# Patient Record
Sex: Female | Born: 1980 | Race: White | Hispanic: No | Marital: Single | Smoking: Never smoker
Health system: Southern US, Community
[De-identification: ages and names within clinical notes are randomized; demographics above are authoritative.]

## PROBLEM LIST (undated history)

## (undated) DIAGNOSIS — Z789 Other specified health status: Secondary | ICD-10-CM

## (undated) HISTORY — PX: NO PAST SURGERIES: SHX2092

## (undated) HISTORY — DX: Other specified health status: Z78.9

---

## 2010-05-20 ENCOUNTER — Inpatient Hospital Stay (HOSPITAL_COMMUNITY)
Admission: AD | Admit: 2010-05-20 | Discharge: 2010-05-23 | Payer: Self-pay | Source: Home / Self Care | Attending: Obstetrics & Gynecology | Admitting: Obstetrics & Gynecology

## 2010-05-22 LAB — RPR: RPR Ser Ql: NONREACTIVE

## 2010-05-22 LAB — CBC
MCV: 89.5 fL (ref 78.0–100.0)
Platelets: 250 10*3/uL (ref 150–400)
RBC: 4.37 MIL/uL (ref 3.87–5.11)
RDW: 13.4 % (ref 11.5–15.5)
WBC: 16.8 10*3/uL — ABNORMAL HIGH (ref 4.0–10.5)

## 2010-05-23 LAB — CBC
MCH: 29.6 pg (ref 26.0–34.0)
Platelets: 196 10*3/uL (ref 150–400)
RBC: 3.35 MIL/uL — ABNORMAL LOW (ref 3.87–5.11)

## 2010-05-23 NOTE — H&P (Addendum)
  Marilyn Frey, Marilyn Frey               ACCOUNT NO.:  [id number]  MEDICAL RECORD NO.:  [id number]          PATIENT TYPE:  INP  LOCATION:  9163                          FACILITY:  [facility]  PHYSICIAN:  Roseanna Rainbow, M.D.DATE OF BIRTH:  11-Dec-1980  DATE OF ADMISSION:  05/20/2010 DATE OF DISCHARGE:                             HISTORY & PHYSICAL   CHIEF COMPLAINTS:  The patient is a 30 year old, para 0 with an estimated date of confinement of May 26, 2010, complaining of contractions and bloody show.  HISTORY OF PRESENT ILLNESS:  Please see the above.  The patient denies rupture of membranes.  ALLERGIES:  CAINE ANESTHETICS.  MEDICATIONS:  Prenatal vitamins.  PRENATAL LABORATORY DATA:  Chlamydia probe negative.  Urine culture and sensitivity no growth.  GC probe negative.  GBS negative on April 25, 2010.  Hepatitis B surface antigen negative.  Hematocrit 35.5, hemoglobin 11.5.  HIV nonreactive.  Platelets 250,000.  Blood type is O+.  Antibody screen negative, RPR nonreactive, rubella immune. Varicella immune.  Two-hour GTT normal.  PAST GYNECOLOGICAL HISTORY:  Noncontributory.  PAST MEDICAL HISTORY:  No significant history of medical diseases.  PAST SURGICAL HISTORY:  No previous surgery.  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  She is single, living with the significant other, not using alcohol currently but formally a minimal user, has no significant smoking history.  She reports stress at work.  She denies illicit drug use.  FAMILY HISTORY:  Remarkable for diabetes or hypertension.  REVIEW OF SYSTEMS:  GU:  Please see the above.  PHYSICAL EXAMINATION:  VITAL SIGNS:  Stable, afebrile.  Fetal heart tracing baseline 140. Accelerations present.  No decelerations. Tocodynamometer coupling uterine contractions every 5 minutes. GENERAL:  Moderate distress. ABDOMEN:  Gravid. GU:  Sterile vaginal exam per the RN.  ASSESSMENT:   Nullipara at term, late latent versus early active labor. Category 1 fetal heart tracing.  PLAN:  Admission, expectant management, anticipate a spontaneous vaginal delivery.     Roseanna Rainbow, M.D.     Marilyn Frey  D:  05/21/2010  T:  05/21/2010  Job:  [id number]  Electronically Signed by Antionette Char M.D. on 05/23/2010 09:20:26 PM

## 2010-05-23 NOTE — Op Note (Addendum)
  Marilyn Frey, Marilyn Frey               ACCOUNT NO.:  [id number]  MEDICAL RECORD NO.:  [id number]          PATIENT TYPE:  INP  LOCATION:  9120                          [facility]ILITY:  [facility]  PHYSICIAN:  Roseanna Rainbow, M.D.DATE OF BIRTH:  [birth year]  DATE OF PROCEDURE:  05/21/2010 DATE OF DISCHARGE:                              OPERATIVE REPORT   DELIVERY NOTE:  The patient had been fully dilated with an active second stage 3 hours in duration.  There was maternal exhaustion.  Category 1 fetal heart tracing.  The position was felt to be OA and the station +3. Informed consent was obtained.  An in-and-out catheterization was performed.  The Kiwi vacuum was applied, however, it was difficult to maintain any suction.  The mushroom vacuum was then applied and after 3 efforts, there was crowning and a midline episiotomy was cut.  The infant's head was then delivered.  The cord was clamped and cut.  The infant was placed on the mother's abdomen.  The placenta was delivered with cord traction.  There were bilateral sulcal tears that were repaired with 2-0 Vicryl Rapide.  There was a left labial tear that was repaired with 3-0 Vicryl Rapide.  The remainder of the episiotomy without extension was repaired in the usual fashion with 2-0 Vicryl and 3-0 Vicryl Rapide.  The estimated blood loss was less than 500 mL.  The mother and infant were in stable condition.     Roseanna Rainbow, M.D.     [name]  D:  05/21/2010  T:  05/21/2010  Job:  [id number]  Electronically Signed by Antionette Char M.D. on 05/23/2010 09:21:36 PM

## 2011-04-30 NOTE — L&D Delivery Note (Signed)
Delivery Note At 10:36 PM a viable female was delivered via Vaginal, Spontaneous Delivery (Presentation: Left Occiput Posterior).  APGAR: 9, 9; weight: 3226 gms .   Placenta status: Intact, Spontaneous.  Cord: 3 vessels with the following complications: None.  Cord pH: none  Anesthesia: Epidural  Episiotomy: None Lacerations: None Suture Repair: None Est. Blood Loss (mL): 250  Mom to postpartum.  Baby to nursery-stable.  HARPER,CHARLES A 12/04/2011, 11:09 PM

## 2011-07-02 LAB — OB RESULTS CONSOLE ABO/RH

## 2011-07-02 LAB — OB RESULTS CONSOLE HEPATITIS B SURFACE ANTIGEN: Hepatitis B Surface Ag: NEGATIVE

## 2011-07-02 LAB — OB RESULTS CONSOLE RUBELLA ANTIBODY, IGM: Rubella: IMMUNE

## 2011-07-02 LAB — OB RESULTS CONSOLE ANTIBODY SCREEN: Antibody Screen: NEGATIVE

## 2011-07-02 LAB — OB RESULTS CONSOLE GC/CHLAMYDIA: Gonorrhea: NEGATIVE

## 2011-12-02 LAB — OB RESULTS CONSOLE GBS: GBS: NEGATIVE

## 2011-12-04 ENCOUNTER — Encounter (HOSPITAL_COMMUNITY): Payer: Self-pay | Admitting: Anesthesiology

## 2011-12-04 ENCOUNTER — Inpatient Hospital Stay (HOSPITAL_COMMUNITY): Payer: 59 | Admitting: Anesthesiology

## 2011-12-04 ENCOUNTER — Encounter (HOSPITAL_COMMUNITY): Payer: Self-pay | Admitting: *Deleted

## 2011-12-04 ENCOUNTER — Encounter (HOSPITAL_COMMUNITY): Payer: Self-pay | Admitting: Obstetrics

## 2011-12-04 ENCOUNTER — Inpatient Hospital Stay (HOSPITAL_COMMUNITY)
Admission: AD | Admit: 2011-12-04 | Discharge: 2011-12-06 | DRG: 775 | Disposition: A | Discharge status: Home / Self Care | Payer: 59 | Source: Ambulatory Visit | Attending: Obstetrics | Admitting: Obstetrics

## 2011-12-04 DIAGNOSIS — O4290 Premature rupture of membranes, unspecified as to length of time between rupture and onset of labor, unspecified weeks of gestation: Principal | ICD-10-CM | POA: Diagnosis present

## 2011-12-04 HISTORY — DX: Other specified health status: Z78.9

## 2011-12-04 LAB — CBC
HCT: 35 % — ABNORMAL LOW (ref 36.0–46.0)
Platelets: 223 10*3/uL (ref 150–400)
RBC: 3.83 MIL/uL — ABNORMAL LOW (ref 3.87–5.11)
RDW: 13.2 % (ref 11.5–15.5)
WBC: 10.6 10*3/uL — ABNORMAL HIGH (ref 4.0–10.5)

## 2011-12-04 MED ORDER — SODIUM CHLORIDE 0.9 % IV SOLN
3.0000 g | Freq: Four times a day (QID) | INTRAVENOUS | Status: DC
  Administered 2011-12-04: 3 g via INTRAVENOUS
  Filled 2011-12-04 (×5): qty 3

## 2011-12-04 MED ORDER — FENTANYL 2.5 MCG/ML BUPIVACAINE 1/10 % EPIDURAL INFUSION (WH - ANES)
14.0000 mL/h | INTRAMUSCULAR | Status: DC
  Administered 2011-12-04: 14 mL/h via EPIDURAL
  Filled 2011-12-04 (×2): qty 60

## 2011-12-04 MED ORDER — DIPHENHYDRAMINE HCL 50 MG/ML IJ SOLN: 12.5000 mg | INTRAMUSCULAR | Status: DC | PRN

## 2011-12-04 MED ORDER — LACTATED RINGERS IV SOLN
500.0000 mL | Freq: Once | INTRAVENOUS | Status: AC
  Administered 2011-12-04: 500 mL via INTRAVENOUS

## 2011-12-04 MED ORDER — NALBUPHINE HCL 10 MG/ML IJ SOLN
10.0000 mg | INTRAMUSCULAR | Status: DC | PRN
  Filled 2011-12-04: qty 1

## 2011-12-04 MED ORDER — CITRIC ACID-SODIUM CITRATE 334-500 MG/5ML PO SOLN: 30.0000 mL | ORAL | Status: DC | PRN

## 2011-12-04 MED ORDER — ONDANSETRON HCL 4 MG/2ML IJ SOLN: 4.0000 mg | Freq: Four times a day (QID) | INTRAMUSCULAR | Status: DC | PRN

## 2011-12-04 MED ORDER — IBUPROFEN 600 MG PO TABS: 600.0000 mg | ORAL_TABLET | Freq: Four times a day (QID) | ORAL | Status: DC | PRN

## 2011-12-04 MED ORDER — NALBUPHINE HCL 10 MG/ML IJ SOLN
10.0000 mg | Freq: Four times a day (QID) | INTRAMUSCULAR | Status: DC | PRN
  Filled 2011-12-04: qty 1

## 2011-12-04 MED ORDER — SODIUM BICARBONATE 8.4 % IV SOLN
INTRAVENOUS | Status: DC | PRN
  Administered 2011-12-04: 5 mL via EPIDURAL

## 2011-12-04 MED ORDER — FLEET ENEMA 7-19 GM/118ML RE ENEM: 1.0000 | ENEMA | RECTAL | Status: DC | PRN

## 2011-12-04 MED ORDER — OXYCODONE-ACETAMINOPHEN 5-325 MG PO TABS: 1.0000 | ORAL_TABLET | ORAL | Status: DC | PRN

## 2011-12-04 MED ORDER — OXYTOCIN 40 UNITS IN LACTATED RINGERS INFUSION - SIMPLE MED
1.0000 m[IU]/min | INTRAVENOUS | Status: DC
  Administered 2011-12-04: 2 m[IU]/min via INTRAVENOUS
  Filled 2011-12-04: qty 1000

## 2011-12-04 MED ORDER — FENTANYL 2.5 MCG/ML BUPIVACAINE 1/10 % EPIDURAL INFUSION (WH - ANES)
INTRAMUSCULAR | Status: DC | PRN
  Administered 2011-12-04: 14 mL/h via EPIDURAL

## 2011-12-04 MED ORDER — PHENYLEPHRINE 40 MCG/ML (10ML) SYRINGE FOR IV PUSH (FOR BLOOD PRESSURE SUPPORT): 80.0000 ug | PREFILLED_SYRINGE | INTRAVENOUS | Status: DC | PRN

## 2011-12-04 MED ORDER — OXYTOCIN 40 UNITS IN LACTATED RINGERS INFUSION - SIMPLE MED
62.5000 mL/h | Freq: Once | INTRAVENOUS | Status: AC
Start: 2011-12-04 — End: 2011-12-04
  Administered 2011-12-04: 62.5 mL/h via INTRAVENOUS

## 2011-12-04 MED ORDER — PHENYLEPHRINE 40 MCG/ML (10ML) SYRINGE FOR IV PUSH (FOR BLOOD PRESSURE SUPPORT)
80.0000 ug | PREFILLED_SYRINGE | INTRAVENOUS | Status: DC | PRN
  Administered 2011-12-04: 200 ug via INTRAVENOUS
  Filled 2011-12-04 (×2): qty 5

## 2011-12-04 MED ORDER — LACTATED RINGERS IV SOLN: 500.0000 mL | INTRAVENOUS | Status: DC | PRN

## 2011-12-04 MED ORDER — LIDOCAINE HCL (PF) 1 % IJ SOLN
30.0000 mL | INTRAMUSCULAR | Status: DC | PRN
  Filled 2011-12-04 (×2): qty 30

## 2011-12-04 MED ORDER — EPHEDRINE 5 MG/ML INJ
10.0000 mg | INTRAVENOUS | Status: DC | PRN
  Filled 2011-12-04 (×2): qty 4

## 2011-12-04 MED ORDER — TERBUTALINE SULFATE 1 MG/ML IJ SOLN: 0.2500 mg | Freq: Once | INTRAMUSCULAR | Status: AC | PRN

## 2011-12-04 MED ORDER — OXYTOCIN BOLUS FROM INFUSION
250.0000 mL | Freq: Once | INTRAVENOUS | Status: DC
  Filled 2011-12-04: qty 500

## 2011-12-04 MED ORDER — LACTATED RINGERS IV SOLN
INTRAVENOUS | Status: DC
  Administered 2011-12-04: 19:00:00 via INTRAVENOUS

## 2011-12-04 MED ORDER — EPHEDRINE 5 MG/ML INJ: 10.0000 mg | INTRAVENOUS | Status: DC | PRN

## 2011-12-04 MED ORDER — ACETAMINOPHEN 325 MG PO TABS: 650.0000 mg | ORAL_TABLET | ORAL | Status: DC | PRN

## 2011-12-04 MED ORDER — LIDOCAINE HCL (PF) 1 % IJ SOLN
INTRAMUSCULAR | Status: DC | PRN
  Administered 2011-12-04 (×2): 4 mL
  Administered 2011-12-04: 8 mL
  Administered 2011-12-04: 4 mL
  Administered 2011-12-04: 8 mL

## 2011-12-04 NOTE — Progress Notes (Signed)
Pt may eat a light snack prior to starting Pitocin per Dr. Clearance Coots

## 2011-12-04 NOTE — Progress Notes (Signed)
Marilyn Frey is a 31 y.o. G2P1001 at [redacted]w[redacted]d by LMP admitted for PROM  Subjective:   Objective: BP 120/74  Pulse 82  Temp 98.3 F (36.8 C) (Oral)  Resp 20  Ht 5\' 6"  (1.676 m)  Wt 84.369 kg (186 lb)  BMI 30.02 kg/m2  SpO2 100%      FHT:  FHR: 150 bpm, variability: moderate,  accelerations:  Present,  decelerations:  Absent UC:   Irregular, mild. SVE:   Dilation: 3.5 Effacement (%): 50 Station: -2 Exam by:: L. Paschal, RN  Labs: Lab Results  Component Value Date   WBC 10.6* 12/04/2011   HGB 11.5* 12/04/2011   HCT 35.0* 12/04/2011   MCV 91.4 12/04/2011   PLT 223 12/04/2011    Assessment / Plan: 36 weeks.  PPROM.  Start low dose pitocin.  Labor: Latent phase. Preeclampsia:  n/a Fetal Wellbeing:  Category I Pain Control:  Nubain I/D:  n/a Anticipated MOD:  NSVD  Jamyah Folk A 12/04/2011, 1:23 PM

## 2011-12-04 NOTE — Progress Notes (Signed)
Marilyn Frey is a 31 y.o. G2P1001 at [redacted]w[redacted]d by LMP admitted for PROM  Subjective:   Objective: BP 75/40  Pulse 100  Temp 98.5 F (36.9 C) (Oral)  Resp 20  Ht 5\' 6"  (1.676 m)  Wt 84.369 kg (186 lb)  BMI 30.02 kg/m2  SpO2 100%   Total I/O In: -  Out: 300 [Urine:300]  FHT:  FHR: 150 bpm, variability: moderate,  accelerations:  Present,  decelerations:  Absent UC:   regular, every 3 minutes SVE:   Dilation: 10 Effacement (%): 100 Station: +1;+2 Exam by:: [employer] RN  Labs: Lab Results  Component Value Date   WBC 10.6* 12/04/2011   HGB 11.5* 12/04/2011   HCT 35.0* 12/04/2011   MCV 91.4 12/04/2011   PLT 223 12/04/2011    Assessment / Plan: Induction of labor due to PROM,  progressing well on pitocin  Labor: Progressing normally Preeclampsia:  n/a Fetal Wellbeing:  Category I Pain Control:  Epidural I/D:  n/a Anticipated MOD:  NSVD  HARPER,CHARLES A 12/04/2011, 10:07 PM

## 2011-12-04 NOTE — Anesthesia Preprocedure Evaluation (Addendum)
Anesthesia Evaluation  Patient identified by MRN, date of birth, ID band Patient awake    Reviewed: Allergy & Precautions, H&P , NPO status , Patient's Chart, lab work & pertinent test results  Airway Mallampati: II TM Distance: >3 FB Neck ROM: full    Dental No notable dental hx.    Pulmonary neg pulmonary ROS,  breath sounds clear to auscultation  Pulmonary exam normal       Cardiovascular negative cardio ROS      Neuro/Psych negative neurological ROS  negative psych ROS   GI/Hepatic negative GI ROS, Neg liver ROS,   Endo/Other  negative endocrine ROS  Renal/GU negative Renal ROS  negative genitourinary   Musculoskeletal negative musculoskeletal ROS (+)   Abdominal Normal abdominal exam  (+)   Peds negative pediatric ROS (+)  Hematology negative hematology ROS (+)   Anesthesia Other Findings   Reproductive/Obstetrics (+) Pregnancy                           Anesthesia Physical Anesthesia Plan  ASA: II  Anesthesia Plan: Epidural   Post-op Pain Management:    Induction:   Airway Management Planned:   Additional Equipment:   Intra-op Plan:   Post-operative Plan:   Informed Consent: I have reviewed the patients History and Physical, chart, labs and discussed the procedure including the risks, benefits and alternatives for the proposed anesthesia with the patient or authorized representative who has indicated his/her understanding and acceptance.     Plan Discussed with:   Anesthesia Plan Comments:         Anesthesia Quick Evaluation  

## 2011-12-04 NOTE — MAU Note (Addendum)
Pt states her water broke about 0700 this am and keeps leaking clear fluid.  No vaginal bleeding.  Good fetal movement. GBS still pending at [facility] from 12-02-11.

## 2011-12-04 NOTE — Anesthesia Procedure Notes (Signed)
Epidural Patient location during procedure: OB Start time: 12/04/2011 7:38 PM End time: 12/04/2011 7:42 PM Reason for block: procedure for pain  Staffing Anesthesiologist: Sandrea Hughs Performed by: anesthesiologist   Preanesthetic Checklist Completed: patient identified, site marked, surgical consent, pre-op evaluation, timeout performed, IV checked, risks and benefits discussed and monitors and equipment checked  Epidural Patient position: sitting Prep: site prepped and draped and DuraPrep Patient monitoring: continuous pulse ox and blood pressure Approach: midline Injection technique: LOR air  Needle:  Needle type: Tuohy  Needle gauge: 17 G Needle length: 9 cm Needle insertion depth: 5 cm cm Catheter type: closed end flexible Catheter size: 19 Gauge Catheter at skin depth: 10 cm Test dose: negative and Other  Assessment Sensory level: T8 Events: blood not aspirated, injection not painful, no injection resistance, negative IV test and no paresthesia

## 2011-12-04 NOTE — H&P (Signed)
Marilyn Frey is a 31 y.o. female presenting for PPROM at 36 weeks.. Maternal Medical History:  Reason for admission: Reason for admission: rupture of membranes.  31 yo G2 P1.  EDC 12-29-11.  Presents with PROM.  GBS negative.  Fetal activity: Perceived fetal activity is normal.   Last perceived fetal movement was within the past hour.    Prenatal complications: no prenatal complications Prenatal Complications - Diabetes: none.    OB History    Grav Para Term Preterm Abortions TAB SAB Ect Mult Living   2 1 1       1      Past Medical History  Diagnosis Date  . No pertinent past medical history    Past Surgical History  Procedure Date  . No past surgeries    Family History: family history includes Diabetes in her father; Hyperlipidemia in her father and mother; and Hypertension in her father and mother. Social History:  reports that she has never smoked. She has never used smokeless tobacco. She reports that she does not drink alcohol or use illicit drugs.   Prenatal Transfer Tool  Maternal Diabetes: No Genetic Screening: Normal Maternal Ultrasounds/Referrals: Normal Fetal Ultrasounds or other Referrals:  None Maternal Substance Abuse:  No Significant Maternal Medications:  Meds include: Other: see prenatal record Significant Maternal Lab Results:  Lab values include: Group B Strep negative Other Comments:  None  Review of Systems  All other systems reviewed and are negative.    Dilation: 3.5 Effacement (%): 50 Station: -2 Exam by:: L. Paschal, RN Blood pressure 120/74, pulse 82, temperature 98.3 F (36.8 C), temperature source Oral, resp. rate 20, height 5\' 6"  (1.676 m), weight 84.369 kg (186 lb), SpO2 100.00%. Maternal Exam:  Uterine Assessment: Contraction strength is mild.  Contraction frequency is irregular.   Abdomen: Patient reports no abdominal tenderness. Fetal presentation: vertex  Introitus: Normal vulva. Normal vagina.    Physical Exam  Nursing  note and vitals reviewed. Constitutional: She is oriented to person, place, and time. She appears well-developed and well-nourished.  HENT:  Head: Normocephalic and atraumatic.  Eyes: Conjunctivae are normal. Pupils are equal, round, and reactive to light.  Neck: Normal range of motion. Neck supple.  Cardiovascular: Normal rate and regular rhythm.   Respiratory: Effort normal and breath sounds normal.  GI: Soft.  Genitourinary: Vagina normal and uterus normal.  Musculoskeletal: Normal range of motion.  Neurological: She is alert and oriented to person, place, and time.  Skin: Skin is warm and dry.  Psychiatric: She has a normal mood and affect. Her behavior is normal. Judgment and thought content normal.    Prenatal labs: ABO, Rh: O/Positive/-- (03/05 0000) Antibody: Negative (03/05 0000) Rubella: Immune (03/05 0000) RPR: Nonreactive (03/05 0000)  HBsAg: Negative (03/05 0000)  HIV: Non-reactive (03/05 0000)  GBS: Negative (08/05 0000)   Assessment/Plan: 36 weeks.  PPROM.  Admit.  Start low dose pitocin per protocol.   Ruey Storer A 12/04/2011, 1:17 PM

## 2011-12-05 LAB — CBC
MCV: 91.4 fL (ref 78.0–100.0)
Platelets: 195 10*3/uL (ref 150–400)
RBC: 3.39 MIL/uL — ABNORMAL LOW (ref 3.87–5.11)
RDW: 13.1 % (ref 11.5–15.5)
WBC: 16 10*3/uL — ABNORMAL HIGH (ref 4.0–10.5)

## 2011-12-05 MED ORDER — OXYTOCIN 40 UNITS IN LACTATED RINGERS INFUSION - SIMPLE MED: 62.5000 mL/h | INTRAVENOUS | Status: DC | PRN

## 2011-12-05 MED ORDER — DIBUCAINE 1 % RE OINT: 1.0000 "application " | TOPICAL_OINTMENT | RECTAL | Status: DC | PRN

## 2011-12-05 MED ORDER — SENNOSIDES-DOCUSATE SODIUM 8.6-50 MG PO TABS
2.0000 | ORAL_TABLET | Freq: Every day | ORAL | Status: DC
  Administered 2011-12-05: 2 via ORAL

## 2011-12-05 MED ORDER — TETANUS-DIPHTH-ACELL PERTUSSIS 5-2.5-18.5 LF-MCG/0.5 IM SUSP: 0.5000 mL | Freq: Once | INTRAMUSCULAR | Status: DC

## 2011-12-05 MED ORDER — ZOLPIDEM TARTRATE 5 MG PO TABS: 5.0000 mg | ORAL_TABLET | Freq: Every evening | ORAL | Status: DC | PRN

## 2011-12-05 MED ORDER — DIPHENHYDRAMINE HCL 25 MG PO CAPS: 25.0000 mg | ORAL_CAPSULE | Freq: Four times a day (QID) | ORAL | Status: DC | PRN

## 2011-12-05 MED ORDER — BENZOCAINE-MENTHOL 20-0.5 % EX AERO: 1.0000 "application " | INHALATION_SPRAY | CUTANEOUS | Status: DC | PRN

## 2011-12-05 MED ORDER — OXYCODONE-ACETAMINOPHEN 5-325 MG PO TABS: 1.0000 | ORAL_TABLET | ORAL | Status: DC | PRN

## 2011-12-05 MED ORDER — WITCH HAZEL-GLYCERIN EX PADS: 1.0000 "application " | MEDICATED_PAD | CUTANEOUS | Status: DC | PRN

## 2011-12-05 MED ORDER — LANOLIN HYDROUS EX OINT: TOPICAL_OINTMENT | CUTANEOUS | Status: DC | PRN

## 2011-12-05 MED ORDER — ONDANSETRON HCL 4 MG PO TABS: 4.0000 mg | ORAL_TABLET | ORAL | Status: DC | PRN

## 2011-12-05 MED ORDER — SIMETHICONE 80 MG PO CHEW: 80.0000 mg | CHEWABLE_TABLET | ORAL | Status: DC | PRN

## 2011-12-05 MED ORDER — MEDROXYPROGESTERONE ACETATE 150 MG/ML IM SUSP: 150.0000 mg | INTRAMUSCULAR | Status: DC | PRN

## 2011-12-05 MED ORDER — PRENATAL MULTIVITAMIN CH
1.0000 | ORAL_TABLET | Freq: Every day | ORAL | Status: DC
  Administered 2011-12-05 – 2011-12-06 (×2): 1 via ORAL
  Filled 2011-12-05: qty 1

## 2011-12-05 MED ORDER — ONDANSETRON HCL 4 MG/2ML IJ SOLN: 4.0000 mg | INTRAMUSCULAR | Status: DC | PRN

## 2011-12-05 MED ORDER — IBUPROFEN 600 MG PO TABS
600.0000 mg | ORAL_TABLET | Freq: Four times a day (QID) | ORAL | Status: DC
  Administered 2011-12-05 – 2011-12-06 (×5): 600 mg via ORAL
  Filled 2011-12-05 (×5): qty 1

## 2011-12-05 NOTE — Progress Notes (Signed)
UR chart review completed.  

## 2011-12-05 NOTE — Progress Notes (Signed)
Post Partum Day 1 Subjective: no complaints  Objective: Blood pressure 114/75, pulse 86, temperature 98.3 F (36.8 C), temperature source Oral, resp. rate 16, height 5\' 6"  (1.676 m), weight 84.369 kg (186 lb), SpO2 95.00%, unknown if currently breastfeeding.  Physical Exam:  General: alert and no distress Lochia: appropriate Uterine Fundus: firm Incision: none DVT Evaluation: No evidence of DVT seen on physical exam.   Basename 12/05/11 0520 12/04/11 1217  HGB 10.5* 11.5*  HCT 31.0* 35.0*    Assessment/Plan: Plan for discharge tomorrow   LOS: 1 day   Nicholis Stepanek A 12/05/2011, 1:26 PM

## 2011-12-05 NOTE — Anesthesia Postprocedure Evaluation (Signed)
  Anesthesia Post Note  Patient: Marilyn Frey  Procedure(s) Performed: * No procedures listed *  Anesthesia type: Epidural  Patient location: Mother/Baby  Post pain: Pain level controlled  Post assessment: Post-op Vital signs reviewed  Last Vitals:  Filed Vitals:   12/05/11 0530  BP: 114/75  Pulse: 86  Temp: 36.8 C  Resp: 16    Post vital signs: Reviewed  Level of consciousness:alert  Complications: No apparent anesthesia complications

## 2011-12-06 MED ORDER — IBUPROFEN 600 MG PO TABS: 600.0000 mg | ORAL_TABLET | Freq: Four times a day (QID) | ORAL | Status: DC

## 2011-12-06 MED ORDER — OXYCODONE-ACETAMINOPHEN 5-325 MG PO TABS: 1.0000 | ORAL_TABLET | ORAL | Status: AC | PRN

## 2011-12-06 NOTE — Progress Notes (Signed)
Post Partum Day 2 Subjective: no complaints  Objective: Blood pressure 116/77, pulse 73, temperature 97.9 F (36.6 C), temperature source Oral, resp. rate 18, height 5\' 6"  (1.676 m), weight 84.369 kg (186 lb), SpO2 95.00%, unknown if currently breastfeeding.  Physical Exam:  General: alert and no distress Lochia: appropriate Uterine Fundus: firm Incision: healing well DVT Evaluation: No evidence of DVT seen on physical exam.   Basename 12/05/11 0520 12/04/11 1217  HGB 10.5* 11.5*  HCT 31.0* 35.0*    Assessment/Plan: Discharge home   LOS: 2 days   HARPER,CHARLES A 12/06/2011, 9:29 AM

## 2011-12-06 NOTE — Discharge Summary (Signed)
Obstetric Discharge Summary Reason for Admission: onset of labor Prenatal Procedures: ultrasound Intrapartum Procedures: spontaneous vaginal delivery Postpartum Procedures: None Complications-Operative and Postpartum: none Hemoglobin  Date Value Range Status  12/05/2011 10.5* 12.0 - 15.0 g/dL Final     HCT  Date Value Range Status  12/05/2011 31.0* 36.0 - 46.0 % Final    Physical Exam:  General: alert and no distress Lochia: appropriate Uterine Fundus: firm Incision: healing well DVT Evaluation: No evidence of DVT seen on physical exam.  Discharge Diagnoses: Term Pregnancy-delivered  Discharge Information: Date: 12/06/2011 Activity: pelvic rest Diet: routine Medications: PNV, Ibuprofen, Colace and Percocet Condition: stable Instructions: refer to practice specific booklet Discharge to: home Follow-up Information    Follow up with Leani Myron A, MD. Schedule an appointment as soon as possible for a visit in 6 weeks.   Contact information:   [address] [place] [address] [phone]          Newborn Data: Live born female  Birth Weight: 7 lb 1.8 oz (3226 g) APGAR: 9, 9  Home with mother.  Myla Mauriello A 12/06/2011, 9:37 AM

## 2014-02-28 ENCOUNTER — Encounter (HOSPITAL_COMMUNITY): Payer: Self-pay | Admitting: Obstetrics

## 2019-04-30 NOTE — L&D Delivery Note (Signed)
Delivery Note Pt progressed w/minimal bleeding to C/C/+1./  After a 10 minute 2nd stage, at 10:41 PM a viable female was delivered via Vaginal, Spontaneous (Presentation: Left Occiput Anterior, loose nuchal/body cord, delivered through).  APGAR: 7, 9; weight 8 lb 9.2 oz (3890 g).  After 1 minute, the cord was clamped and cut. 40 units of pitocin diluted in 1000cc LR was infused rapidly IV.  The placenta separated spontaneously and delivered via CCT and maternal pushing effort.  It was inspected and appears to be intact with a 3 VC.  Anesthesia: Epidural Episiotomy: None Lacerations: 1st degree;Vaginal Suture Repair: 2.0 chromic Est. Blood Loss (mL): 386  Mom to postpartum.  Baby to Couplet care / Skin to Skin.  Scarlette Calico Cresenzo-Dishmon 12/31/2019, 12:49 AM

## 2019-06-25 ENCOUNTER — Ambulatory Visit (INDEPENDENT_AMBULATORY_CARE_PROVIDER_SITE_OTHER): Payer: 59 | Admitting: *Deleted

## 2019-06-25 ENCOUNTER — Other Ambulatory Visit: Payer: Self-pay

## 2019-06-25 DIAGNOSIS — Z3201 Encounter for pregnancy test, result positive: Secondary | ICD-10-CM

## 2019-06-25 DIAGNOSIS — Z3200 Encounter for pregnancy test, result unknown: Secondary | ICD-10-CM

## 2019-06-25 LAB — POCT PREGNANCY, URINE: Preg Test, Ur: POSITIVE — AB

## 2019-06-25 NOTE — Progress Notes (Signed)
Pt here for pregnancy test. She was informed of positive result. She reports approximate LMP 03/25/19 which would yield EDD 12/30/19 - now 13w 2d. Pt denies having problems currently. She is taking PNV.  Pt has had prenatal care @ [facility] location in the past and reports no problems. She desires prenatal care in this office for current pregnancy. Appts will be scheduled.

## 2019-06-25 NOTE — Progress Notes (Signed)
Patient ID: Marilyn Frey, female   DOB: 11/15/1980, 39 y.o.   MRN: [id number] Patient seen and assessed by nursing staff during this encounter. I have reviewed the chart and agree with the documentation and plan.  Scheryl Darter, MD 06/25/2019 10:48 AM

## 2019-07-22 ENCOUNTER — Ambulatory Visit (INDEPENDENT_AMBULATORY_CARE_PROVIDER_SITE_OTHER): Payer: Medicaid Other | Admitting: *Deleted

## 2019-07-22 ENCOUNTER — Other Ambulatory Visit: Payer: Self-pay

## 2019-07-22 DIAGNOSIS — O0990 Supervision of high risk pregnancy, unspecified, unspecified trimester: Secondary | ICD-10-CM

## 2019-07-22 DIAGNOSIS — O09529 Supervision of elderly multigravida, unspecified trimester: Secondary | ICD-10-CM

## 2019-07-22 MED ORDER — BLOOD PRESSURE KIT DEVI: 1.0000 | 0 refills | Status: AC | PRN

## 2019-07-22 NOTE — Progress Notes (Addendum)
I connected with  Marilyn Frey on 07/22/19 at  3:30 PM EDT by telephone and verified that I am speaking with the correct person using two identifiers.   I discussed the limitations, risks, security and privacy concerns of performing an evaluation and management service by telephone and the availability of in person appointments. I also discussed with the patient that there may be a patient responsible charge related to this service. The patient expressed understanding and agreed to proceed.  I explained I am completing her New OB Intake today. We discussed Her EDD and that it is based on  sure LMP . I reviewed her allergies, meds, OB History, Medical /Surgical history, and appropriate screenings. I informed her of [facility] services.  I explained I will send her the Babyscripts app and app was sent to her while on phone.  I explained we will send a blood pressure cuff to [facility] that will fill that prescription and we called [facility] to verify they received her prescription and confirmed she will pick up the cuff.  I asked her to bring the blood pressure cuff with her to her first ob appointment so we can show her how to use it. Explained  then we will have her take her blood pressure weekly and enter into the app. I explained she will have some visits in office and some virtually. She already has MyChart and I assisted her with downloading the  app. I reviewed her new ob  appointment date/ time with her , our location and to wear mask, no visitors.  I explained she will have a pelvic exam, ob bloodwork, hemoglobin a1C, cbg ,pap, and  genetic testing if desired,- she does want a panorama.She also has not had a flu shot and would like to get that .  I scheduled an [place] as close to 19 weeks as possible and at a time Jazalyn could attend. I  gave her the appointment. She voices understanding.  Ilona Colley,RN 07/22/2019  3:29 PM    Chart reviewed for nurse visit. Agree with plan of care.   Currie Paris, NP 07/22/2019 7:04 PM

## 2019-07-22 NOTE — Patient Instructions (Signed)

## 2019-07-26 ENCOUNTER — Ambulatory Visit (INDEPENDENT_AMBULATORY_CARE_PROVIDER_SITE_OTHER): Payer: 59 | Admitting: Nurse Practitioner

## 2019-07-26 ENCOUNTER — Other Ambulatory Visit (HOSPITAL_COMMUNITY)
Admission: RE | Admit: 2019-07-26 | Discharge: 2019-07-26 | Disposition: A | Discharge status: Home / Self Care | Payer: 59 | Source: Ambulatory Visit | Attending: Nurse Practitioner | Admitting: Nurse Practitioner

## 2019-07-26 ENCOUNTER — Encounter: Payer: Self-pay | Admitting: *Deleted

## 2019-07-26 ENCOUNTER — Other Ambulatory Visit: Payer: Self-pay

## 2019-07-26 VITALS — BP 115/67 | HR 87 | Wt 199.7 lb

## 2019-07-26 DIAGNOSIS — Z23 Encounter for immunization: Secondary | ICD-10-CM | POA: Diagnosis not present

## 2019-07-26 DIAGNOSIS — O0992 Supervision of high risk pregnancy, unspecified, second trimester: Secondary | ICD-10-CM

## 2019-07-26 DIAGNOSIS — O09522 Supervision of elderly multigravida, second trimester: Secondary | ICD-10-CM

## 2019-07-26 DIAGNOSIS — O0990 Supervision of high risk pregnancy, unspecified, unspecified trimester: Secondary | ICD-10-CM

## 2019-07-26 DIAGNOSIS — O09529 Supervision of elderly multigravida, unspecified trimester: Secondary | ICD-10-CM

## 2019-07-26 DIAGNOSIS — Z3A17 17 weeks gestation of pregnancy: Secondary | ICD-10-CM

## 2019-07-26 LAB — POCT URINALYSIS DIP (DEVICE)
Bilirubin Urine: NEGATIVE
Glucose, UA: NEGATIVE mg/dL
Hgb urine dipstick: NEGATIVE
Ketones, ur: NEGATIVE mg/dL
Nitrite: NEGATIVE
Protein, ur: NEGATIVE mg/dL
Specific Gravity, Urine: 1.01 (ref 1.005–1.030)
Urobilinogen, UA: 0.2 mg/dL (ref 0.0–1.0)
pH: 5.5 (ref 5.0–8.0)

## 2019-07-26 NOTE — Progress Notes (Signed)
Subjective:   Marilyn Frey is a 39 y.o. G3P1102 at 87w4dby LMP being seen today for her first obstetrical visit.  Her obstetrical history is significant for advanced maternal age and obesity. Patient does intend to breast feed. Pregnancy history fully reviewed.  Patient reports no complaints.  HISTORY: OB History  Gravida Para Term Preterm AB Living  '3 2 1 1 '$ 0 2  SAB TAB Ectopic Multiple Live Births  0 0 0 0 2    # Outcome Date GA Lbr Len/2nd Weight Sex Delivery Anes PTL Lv  3 Current           2 Preterm 12/04/11 381w3d4:43 / 00:53 7 lb 1.8 oz (3.226 kg) M Vag-Spont EPI  LIV     Birth Comments: WDL; PROM      Name: LEDonne Anon   Apgar1: 9  Apgar5: 9  1 Term 04/2010 4097w0d lb 8 oz (3.402 kg) F Vag-Spont EPI  LIV     Birth Comments: wnl ; long labor    Past Medical History:  Diagnosis Date  . No pertinent past medical history    Past Surgical History:  Procedure Laterality Date  . NO PAST SURGERIES     Family History  Problem Relation Age of Onset  . Hypertension Mother   . Hyperlipidemia Mother   . Diabetes Father   . Hypertension Father   . Hyperlipidemia Father    Social History   Tobacco Use  . Smoking status: Never Smoker  . Smokeless tobacco: Never Used  Substance Use Topics  . Alcohol use: Not Currently    Comment: occasional beer  . Drug use: No   No Known Allergies Current Outpatient Medications on File Prior to Visit  Medication Sig Dispense Refill  . acetaminophen (TYLENOL) 325 MG tablet Take 650 mg by mouth every 6 (six) hours as needed.    . Blood Pressure Monitoring (BLOOD PRESSURE KIT) DEVI 1 Device by Does not apply route as needed. 1 each 0  . calcium carbonate (TUMS - DOSED IN MG ELEMENTAL CALCIUM) 500 MG chewable tablet Chew 2 tablets by mouth daily.    . Prenatal Vit-Fe Fumarate-FA (PRENATAL MULTIVITAMIN) TABS Take 1 tablet by mouth every morning.     No current facility-administered medications on file prior to visit.      Exam   Vitals:   07/26/19 1344  BP: 115/67  Pulse: 87  Weight: 199 lb 11.2 oz (90.6 kg)   Fetal Heart Rate (bpm): 154  Uterus:   Fundus 18 cm  Pelvic Exam: Perineum: no hemorrhoids, normal perineum   Vulva: normal external genitalia, no lesions   Vagina:  normal mucosa, normal discharge   Cervix: no lesions and normal, pap smear done.    Adnexa: normal adnexa and no mass, fullness, tenderness   Bony Pelvis: average  System: General: well-developed, well-nourished female in no acute distress   Breast:  normal appearance, no masses or tenderness, nipples do not extend well, tend to flatten toward chest wall with nipple exam   Skin: normal coloration and turgor, no rashes   Neurologic: oriented, normal, negative, normal mood   Extremities: normal strength, tone, and muscle mass, ROM of all joints is normal   HEENT extraocular movement intact and sclera clear, anicteric   Mouth/Teeth deferred   Neck supple and no masses, normal thyroid   Cardiovascular: regular rate and rhythm   Respiratory:  no respiratory distress, normal breath sounds  Abdomen: soft, non-tender; no masses,  no organomegaly     Assessment:   Pregnancy: [id number] Patient Active Problem List   Diagnosis Date Noted  . Supervision of high risk pregnancy, antepartum 07/22/2019  . AMA (advanced maternal age) multigravida 35+ 07/22/2019     Plan:  1. Supervision of high risk pregnancy, antepartum Reviewed babyscripts and MyChart apps Got BP cuff today Plans epidural for labor - has had with other births Will need nipple shells at 36 weeks to help nipple ligaments stretch prior to birth - did not have good previous breastfeeding experiences.  - POCT urinalysis dip (device) - Cytology - PAP( [facility]) - Culture, OB Urine - Flu Vaccine QUAD 36+ mos IM - Genetic Screening - Obstetric Panel, Including HIV - Hemoglobin A1c - AFP, Serum, Open Spina Bifida  2.  Advanced maternal age - genetic testing  done  3.  Obesity Unsure of prepregnancy weight - was a guess, not actual prepregnancy weight.  Over 20 pound weight gain recorded but initial weight unsure. Drink at least 8 8-oz glasses of water every day. Advised to switch to sugar free sodas or drink water.  Initial labs drawn. Continue prenatal vitamins. Genetic Screening discussed, AFP and NIPS: ordered. Ultrasound discussed; fetal anatomic survey: ordered. Problem list reviewed and updated. The nature of [place] with multiple MDs and other Advanced Practice Providers was explained to patient; also emphasized that residents, students are part of our team. Routine obstetric precautions reviewed. Return in about 4 weeks (around 08/23/2019) for virtual visit in 4 weeks and lab visit tomorrow.  Total face-to-face time with patient: 40 minutes.  Over 50% of encounter was spent on counseling and coordination of care.     Earlie Server, FNP Family Nurse Practitioner, [facility] for [employer], [place] [facility] 07/26/2019 2:43 PM

## 2019-07-26 NOTE — Patient Instructions (Signed)

## 2019-07-26 NOTE — Progress Notes (Signed)
Medicaid Home Form Completed-07/26/19

## 2019-07-27 ENCOUNTER — Other Ambulatory Visit: Payer: Medicaid Other

## 2019-07-28 LAB — OBSTETRIC PANEL, INCLUDING HIV
Antibody Screen: NEGATIVE
Basophils Absolute: 0 10*3/uL (ref 0.0–0.2)
Basos: 0 %
EOS (ABSOLUTE): 0 10*3/uL (ref 0.0–0.4)
Eos: 1 %
HIV Screen 4th Generation wRfx: NONREACTIVE
Hematocrit: 34.9 % (ref 34.0–46.6)
Hemoglobin: 11.7 g/dL (ref 11.1–15.9)
Hepatitis B Surface Ag: NEGATIVE
Immature Grans (Abs): 0 10*3/uL (ref 0.0–0.1)
Immature Granulocytes: 0 %
Lymphocytes Absolute: 0.8 10*3/uL (ref 0.7–3.1)
Lymphs: 11 %
MCH: 29.6 pg (ref 26.6–33.0)
MCHC: 33.5 g/dL (ref 31.5–35.7)
MCV: 88 fL (ref 79–97)
Monocytes Absolute: 0.5 10*3/uL (ref 0.1–0.9)
Monocytes: 6 %
Neutrophils Absolute: 6.1 10*3/uL (ref 1.4–7.0)
Neutrophils: 82 %
Platelets: 224 10*3/uL (ref 150–450)
RBC: 3.95 x10E6/uL (ref 3.77–5.28)
RDW: 13.1 % (ref 11.7–15.4)
RPR Ser Ql: NONREACTIVE
Rh Factor: POSITIVE
Rubella Antibodies, IGG: 3.38 index (ref >0.99)
WBC: 7.4 10*3/uL (ref 3.4–10.8)

## 2019-07-28 LAB — CYTOLOGY - PAP
Chlamydia: NEGATIVE
Comment: NEGATIVE
Comment: NEGATIVE
Comment: NORMAL
Diagnosis: NEGATIVE
High risk HPV: NEGATIVE
Neisseria Gonorrhea: NEGATIVE

## 2019-07-28 LAB — HEMOGLOBIN A1C
Est. average glucose Bld gHb Est-mCnc: 105 mg/dL
Hgb A1c MFr Bld: 5.3 % (ref 4.8–5.6)

## 2019-07-28 LAB — CULTURE, OB URINE

## 2019-07-28 LAB — URINE CULTURE, OB REFLEX

## 2019-07-29 LAB — AFP, SERUM, OPEN SPINA BIFIDA
AFP MoM: 1.36
AFP Value: 48.2 ng/mL
Gest. Age on Collection Date: 17.6 weeks
Maternal Age At EDD: 39.3 yr
OSBR Risk 1 IN: 4034
Test Results:: NEGATIVE
Weight: 200 [lb_av]

## 2019-08-11 ENCOUNTER — Encounter: Payer: Self-pay | Admitting: *Deleted

## 2019-08-13 ENCOUNTER — Ambulatory Visit (HOSPITAL_COMMUNITY): Payer: 59 | Admitting: *Deleted

## 2019-08-13 ENCOUNTER — Encounter (HOSPITAL_COMMUNITY): Payer: Self-pay

## 2019-08-13 ENCOUNTER — Other Ambulatory Visit: Payer: Self-pay

## 2019-08-13 ENCOUNTER — Ambulatory Visit (HOSPITAL_COMMUNITY)
Admission: RE | Admit: 2019-08-13 | Discharge: 2019-08-13 | Disposition: A | Discharge status: Home / Self Care | Payer: 59 | Source: Ambulatory Visit | Attending: Nurse Practitioner | Admitting: Nurse Practitioner

## 2019-08-13 ENCOUNTER — Other Ambulatory Visit (HOSPITAL_COMMUNITY): Payer: Self-pay | Admitting: *Deleted

## 2019-08-13 DIAGNOSIS — Z363 Encounter for antenatal screening for malformations: Secondary | ICD-10-CM

## 2019-08-13 DIAGNOSIS — O09522 Supervision of elderly multigravida, second trimester: Secondary | ICD-10-CM | POA: Insufficient documentation

## 2019-08-13 DIAGNOSIS — Z3A20 20 weeks gestation of pregnancy: Secondary | ICD-10-CM

## 2019-08-13 DIAGNOSIS — O0990 Supervision of high risk pregnancy, unspecified, unspecified trimester: Secondary | ICD-10-CM

## 2019-08-13 DIAGNOSIS — O09529 Supervision of elderly multigravida, unspecified trimester: Secondary | ICD-10-CM

## 2019-08-23 ENCOUNTER — Encounter: Payer: Self-pay | Admitting: Obstetrics and Gynecology

## 2019-08-23 ENCOUNTER — Telehealth (INDEPENDENT_AMBULATORY_CARE_PROVIDER_SITE_OTHER): Payer: Medicaid Other | Admitting: Obstetrics and Gynecology

## 2019-08-23 DIAGNOSIS — Z3A21 21 weeks gestation of pregnancy: Secondary | ICD-10-CM

## 2019-08-23 DIAGNOSIS — O0990 Supervision of high risk pregnancy, unspecified, unspecified trimester: Secondary | ICD-10-CM

## 2019-08-23 DIAGNOSIS — O09522 Supervision of elderly multigravida, second trimester: Secondary | ICD-10-CM

## 2019-08-23 NOTE — Progress Notes (Signed)
   MY CHART VIDEO VIRTUAL OBSTETRICS VISIT ENCOUNTER NOTE  I connected with Marilyn Frey on 08/23/19 at  1:55 PM EDT by My Chart video at home and verified that I am speaking with the correct person using two identifiers.   I discussed the limitations, risks, security and privacy concerns of performing an evaluation and management service by My Chart video and the availability of in person appointments. I also discussed with the patient that there may be a patient responsible charge related to this service. The patient expressed understanding and agreed to proceed.  Subjective:  Marilyn Frey is a 39 y.o. G3P1102 at [redacted]w[redacted]d being followed for ongoing prenatal care.  She is currently monitored for the following issues for this low-risk pregnancy and has Supervision of high risk pregnancy, antepartum and AMA (advanced maternal age) multigravida 35+ on their problem list.  Patient reports no complaints. Reports fetal movement. Denies any contractions, bleeding or leaking of fluid.   The following portions of the patient's history were reviewed and updated as appropriate: allergies, current medications, past family history, past medical history, past social history, past surgical history and problem list.   Objective:   General:  Alert, oriented and cooperative.   Mental Status: Normal mood and affect perceived. Normal judgment and thought content.  Rest of physical exam deferred due to type of encounter  BP 127/81   Pulse 84   LMP 03/25/2019 (Approximate)  **Done by patient's own at home BP cuff -- she does not own a scale  Assessment and Plan:  Pregnancy: G3P1102 at [redacted]w[redacted]d  1. Supervision of high risk pregnancy, antepartum - Advised an purchase a home scale at [employer] store - Anticipatory guidance for 2 hr GTT, 3rd trimester labs at nv - Advised office move this weekend and if have OB emergency to go to MAU   Preterm labor symptoms and general obstetric precautions including but  not limited to vaginal bleeding, contractions, leaking of fluid and fetal movement were reviewed in detail with the patient.  I discussed the assessment and treatment plan with the patient. The patient was provided an opportunity to ask questions and all were answered. The patient agreed with the plan and demonstrated an understanding of the instructions. The patient was advised to call back or seek an in-person office evaluation/go to MAU at [facility] for any urgent or concerning symptoms. Please refer to After Visit Summary for other counseling recommendations.   I provided 5 minutes of non-face-to-face time during this encounter. There was 5 minutes of chart review time spent prior to this encounter. Total time spent = 10 minutes.  Return in about 7 weeks (around 10/11/2019) for Return OB 2hr GTT.  Future Appointments  Date Time Provider Department Center  09/10/2019 10:45 AM [facility] NURSE [facility]-MFC MFC-US  09/10/2019 10:45 AM [facility] [place] 4 WH-MFCUS MFC-US    Raelyn Mora, CNM Center for [employer], [facility]

## 2019-08-24 ENCOUNTER — Telehealth: Payer: Self-pay

## 2019-08-24 ENCOUNTER — Encounter: Payer: Self-pay | Admitting: Nurse Practitioner

## 2019-08-24 ENCOUNTER — Other Ambulatory Visit: Payer: Self-pay | Admitting: Nurse Practitioner

## 2019-08-24 DIAGNOSIS — Z148 Genetic carrier of other disease: Secondary | ICD-10-CM

## 2019-08-24 NOTE — Telephone Encounter (Signed)
Called pt regarding [employer], no answer, left VM to call back.

## 2019-08-24 NOTE — Progress Notes (Signed)
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  Nolene Bernheim, RN, MSN, NP-BC Nurse Practitioner, [facility] for [employer], [facility] 08/24/2019 5:11 PM

## 2019-08-24 NOTE — Telephone Encounter (Signed)
Pt called Nurse line regarding missed call about [employer], Called pt back & asked her to let us know if it's ok to leave test results on VM if needed.

## 2019-09-10 ENCOUNTER — Other Ambulatory Visit: Payer: Self-pay | Admitting: *Deleted

## 2019-09-10 ENCOUNTER — Other Ambulatory Visit: Payer: Self-pay

## 2019-09-10 ENCOUNTER — Ambulatory Visit (HOSPITAL_COMMUNITY): Payer: 59 | Attending: Obstetrics and Gynecology

## 2019-09-10 ENCOUNTER — Ambulatory Visit: Payer: 59 | Admitting: *Deleted

## 2019-09-10 DIAGNOSIS — Z362 Encounter for other antenatal screening follow-up: Secondary | ICD-10-CM | POA: Diagnosis not present

## 2019-09-10 DIAGNOSIS — O09529 Supervision of elderly multigravida, unspecified trimester: Secondary | ICD-10-CM

## 2019-09-10 DIAGNOSIS — O09522 Supervision of elderly multigravida, second trimester: Secondary | ICD-10-CM

## 2019-09-10 DIAGNOSIS — O0990 Supervision of high risk pregnancy, unspecified, unspecified trimester: Secondary | ICD-10-CM | POA: Diagnosis present

## 2019-09-10 DIAGNOSIS — Z3A24 24 weeks gestation of pregnancy: Secondary | ICD-10-CM | POA: Diagnosis not present

## 2019-09-10 DIAGNOSIS — Z148 Genetic carrier of other disease: Secondary | ICD-10-CM | POA: Diagnosis not present

## 2019-09-11 ENCOUNTER — Ambulatory Visit: Payer: 59 | Attending: Internal Medicine

## 2019-09-11 DIAGNOSIS — Z23 Encounter for immunization: Secondary | ICD-10-CM

## 2019-09-11 NOTE — Progress Notes (Signed)
   Covid-19 Vaccination Clinic  Name:  Marilyn Frey    MRN: [id number] DOB: [birth year]  09/11/2019  Marilyn Frey was observed post Covid-19 immunization for 15 minutes without incident. She was provided with Vaccine Information Sheet and instruction to access the V-Safe system.   Marilyn Frey was instructed to call 911 with any severe reactions post vaccine: Marland Kitchen Difficulty breathing  . Swelling of face and throat  . A fast heartbeat  . A bad rash all over body  . Dizziness and weakness   Immunizations Administered    Name Date Dose VIS Date Route   Pfizer COVID-19 Vaccine 09/11/2019 10:14 AM 0.3 mL 06/23/2018 Intramuscular   Manufacturer: [employer], [employer]   Lot: [id number]   NDC: [address]-2

## 2019-10-04 ENCOUNTER — Ambulatory Visit: Payer: 59

## 2019-10-11 ENCOUNTER — Ambulatory Visit: Payer: 59 | Attending: Internal Medicine

## 2019-10-11 ENCOUNTER — Encounter: Payer: Self-pay | Admitting: Certified Nurse Midwife

## 2019-10-11 ENCOUNTER — Other Ambulatory Visit: Payer: Self-pay

## 2019-10-11 ENCOUNTER — Other Ambulatory Visit: Payer: 59

## 2019-10-11 ENCOUNTER — Ambulatory Visit (INDEPENDENT_AMBULATORY_CARE_PROVIDER_SITE_OTHER): Payer: 59 | Admitting: Certified Nurse Midwife

## 2019-10-11 VITALS — BP 106/77 | HR 84 | Wt 204.6 lb

## 2019-10-11 DIAGNOSIS — O0990 Supervision of high risk pregnancy, unspecified, unspecified trimester: Secondary | ICD-10-CM

## 2019-10-11 DIAGNOSIS — Z23 Encounter for immunization: Secondary | ICD-10-CM

## 2019-10-11 DIAGNOSIS — O09523 Supervision of elderly multigravida, third trimester: Secondary | ICD-10-CM

## 2019-10-11 DIAGNOSIS — Z3A28 28 weeks gestation of pregnancy: Secondary | ICD-10-CM

## 2019-10-11 DIAGNOSIS — O0993 Supervision of high risk pregnancy, unspecified, third trimester: Secondary | ICD-10-CM

## 2019-10-11 NOTE — Progress Notes (Signed)
   Covid-19 Vaccination Clinic  Name:  Marilyn Frey    MRN: [id number] DOB: 09-Mar-1981  10/11/2019  Ms. Deason was observed post Covid-19 immunization for 15 minutes without incident. She was provided with Vaccine Information Sheet and instruction to access the V-Safe system.   Ms. Toelle was instructed to call 911 with any severe reactions post vaccine: Marland Kitchen Difficulty breathing  . Swelling of face and throat  . A fast heartbeat  . A bad rash all over body  . Dizziness and weakness   Immunizations Administered    Name Date Dose VIS Date Route   Pfizer COVID-19 Vaccine 10/11/2019  3:27 PM 0.3 mL 06/23/2018 Intramuscular   Manufacturer: [employer], [employer]   Lot: [id number]   NDC: [address]-9

## 2019-10-11 NOTE — Progress Notes (Signed)
   PRENATAL VISIT NOTE  Subjective:  Marilyn Frey is a 39 y.o. G3P1102 at [redacted]w[redacted]d being seen today for ongoing prenatal care.  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  Patient reports no complaints.  Contractions: Not present. Vag. Bleeding: None.  Movement: Present. Denies leaking of fluid.   The following portions of the patient's history were reviewed and updated as appropriate: allergies, current medications, past family history, past medical history, past social history, past surgical history and problem list.   Objective:   Vitals:   10/11/19 0841  BP: 106/77  Pulse: 84  Weight: 204 lb 9.6 oz (92.8 kg)    Fetal Status: Fetal Heart Rate (bpm): 145 Fundal Height: 27 cm Movement: Present     General:  Alert, oriented and cooperative. Patient is in no acute distress.  Skin: Skin is warm and dry. No rash noted.   Cardiovascular: Normal heart rate noted  Respiratory: Normal respiratory effort, no problems with respiration noted  Abdomen: Soft, gravid, appropriate for gestational age.  Pain/Pressure: Present     Pelvic: Cervical exam deferred        Extremities: Normal range of motion.  Edema: None  Mental Status: Normal mood and affect. Normal behavior. Normal judgment and thought content.   Assessment and Plan:  Pregnancy: G3P1102 at [redacted]w[redacted]d 1. Supervision of high risk pregnancy, antepartum - Patient doing well, no complaints  - Patient has not received TDAP, patient reports that she gets 2nd COVID vaccine today, will not give TDAP today  - Routine prenatal care - Anticipatory guidance on upcoming appointments   - GTT done today, discussed results of GDM appointment and what results mean   2. Multigravida of advanced maternal age in third trimester - <30 years old   Preterm labor  symptoms and general obstetric precautions including but not limited to vaginal bleeding, contractions, leaking of fluid and fetal movement were reviewed in detail with the patient. Please refer to After Visit Summary for other counseling recommendations.   Return in about 2 weeks (around 10/25/2019) for ROB-mychart.  Future Appointments  Date Time Provider Department Center  10/11/2019  3:30 PM [facility]  11/05/2019 10:00 AM [facility] NURSE [facility]  11/05/2019 10:00 AM [facility] US1 [facility]    Sharyon Cable, CNM

## 2019-10-11 NOTE — Patient Instructions (Signed)
Glucose Tolerance Test During Pregnancy Why am I having this test? The glucose tolerance test (GTT) is done to check how your body processes sugar (glucose). This is one of several tests used to diagnose diabetes that develops during pregnancy (gestational diabetes mellitus). Gestational diabetes is a temporary form of diabetes that some women develop during pregnancy. It usually occurs during the second trimester of pregnancy and goes away after delivery. Testing (screening) for gestational diabetes usually occurs between 24 and 28 weeks of pregnancy. You may have the GTT test after having a 1-hour glucose screening test if the results from that test indicate that you may have gestational diabetes. You may also have this test if:  You have a history of gestational diabetes.  You have a history of giving birth to very large babies or have experienced repeated fetal loss (stillbirth).  You have signs and symptoms of diabetes, such as: ? Changes in your vision. ? Tingling or numbness in your hands or feet. ? Changes in hunger, thirst, and urination that are not otherwise explained by your pregnancy. What is being tested? This test measures the amount of glucose in your blood at different times during a period of 3 hours. This indicates how well your body is able to process glucose. What kind of sample is taken?  Blood samples are required for this test. They are usually collected by inserting a needle into a blood vessel. How do I prepare for this test?  For 3 days before your test, eat normally. Have plenty of carbohydrate-rich foods.  Follow instructions from your health care provider about: ? Eating or drinking restrictions on the day of the test. You may be asked to not eat or drink anything other than water (fast) starting 8-10 hours before the test. ? Changing or stopping your regular medicines. Some medicines may interfere with this test. Tell a health care provider about:  All  medicines you are taking, including vitamins, herbs, eye drops, creams, and over-the-counter medicines.  Any blood disorders you have.  Any surgeries you have had.  Any medical conditions you have. What happens during the test? First, your blood glucose will be measured. This is referred to as your fasting blood glucose, since you fasted before the test. Then, you will drink a glucose solution that contains a certain amount of glucose. Your blood glucose will be measured again 1, 2, and 3 hours after drinking the solution. This test takes about 3 hours to complete. You will need to stay at the testing location during this time. During the testing period:  Do not eat or drink anything other than the glucose solution.  Do not exercise.  Do not use any products that contain nicotine or tobacco, such as cigarettes and e-cigarettes. If you need help stopping, ask your health care provider. The testing procedure may vary among health care providers and hospitals. How are the results reported? Your results will be reported as milligrams of glucose per deciliter of blood (mg/dL) or millimoles per liter (mmol/L). Your health care provider will compare your results to normal ranges that were established after testing a large group of people (reference ranges). Reference ranges may vary among labs and hospitals. For this test, common reference ranges are:  Fasting: less than 95-105 mg/dL (5.3-5.8 mmol/L).  1 hour after drinking glucose: less than 180-190 mg/dL (10.0-10.5 mmol/L).  2 hours after drinking glucose: less than 155-165 mg/dL (8.6-9.2 mmol/L).  3 hours after drinking glucose: 140-145 mg/dL (7.8-8.1 mmol/L). What do the   results mean? Results within reference ranges are considered normal, meaning that your glucose levels are well-controlled. If two or more of your blood glucose levels are high, you may be diagnosed with gestational diabetes. If only one level is high, your health care  provider may suggest repeat testing or other tests to confirm a diagnosis. Talk with your health care provider about what your results mean. Questions to ask your health care provider Ask your health care provider, or the department that is doing the test:  When will my results be ready?  How will I get my results?  What are my treatment options?  What other tests do I need?  What are my next steps? Summary  The glucose tolerance test (GTT) is one of several tests used to diagnose diabetes that develops during pregnancy (gestational diabetes mellitus). Gestational diabetes is a temporary form of diabetes that some women develop during pregnancy.  You may have the GTT test after having a 1-hour glucose screening test if the results from that test indicate that you may have gestational diabetes. You may also have this test if you have any symptoms or risk factors for gestational diabetes.  Talk with your health care provider about what your results mean. This information is not intended to replace advice given to you by your health care provider. Make sure you discuss any questions you have with your health care provider. Document Revised: 08/06/2018 Document Reviewed: 11/25/2016 Elsevier Patient Education  2020 Elsevier Inc.  

## 2019-10-12 LAB — GLUCOSE TOLERANCE, 2 HOURS W/ 1HR
Glucose, 1 hour: 126 mg/dL (ref 65–179)
Glucose, 2 hour: 145 mg/dL (ref 65–152)
Glucose, Fasting: 80 mg/dL (ref 65–91)

## 2019-10-12 LAB — CBC
Hematocrit: 32.6 % — ABNORMAL LOW (ref 34.0–46.6)
Hemoglobin: 11.1 g/dL (ref 11.1–15.9)
MCH: 31.3 pg (ref 26.6–33.0)
MCHC: 34 g/dL (ref 31.5–35.7)
MCV: 92 fL (ref 79–97)
Platelets: 217 10*3/uL (ref 150–450)
RBC: 3.55 x10E6/uL — ABNORMAL LOW (ref 3.77–5.28)
RDW: 12.7 % (ref 11.7–15.4)
WBC: 7.4 10*3/uL (ref 3.4–10.8)

## 2019-10-12 LAB — HIV ANTIBODY (ROUTINE TESTING W REFLEX): HIV Screen 4th Generation wRfx: NONREACTIVE

## 2019-10-12 LAB — RPR: RPR Ser Ql: NONREACTIVE

## 2019-10-26 ENCOUNTER — Telehealth (INDEPENDENT_AMBULATORY_CARE_PROVIDER_SITE_OTHER): Payer: 59 | Admitting: Family Medicine

## 2019-10-26 ENCOUNTER — Encounter: Payer: Self-pay | Admitting: Family Medicine

## 2019-10-26 VITALS — BP 130/77 | HR 96

## 2019-10-26 DIAGNOSIS — Z148 Genetic carrier of other disease: Secondary | ICD-10-CM

## 2019-10-26 DIAGNOSIS — Z3A30 30 weeks gestation of pregnancy: Secondary | ICD-10-CM

## 2019-10-26 DIAGNOSIS — O0993 Supervision of high risk pregnancy, unspecified, third trimester: Secondary | ICD-10-CM

## 2019-10-26 DIAGNOSIS — O09523 Supervision of elderly multigravida, third trimester: Secondary | ICD-10-CM

## 2019-10-26 DIAGNOSIS — O0990 Supervision of high risk pregnancy, unspecified, unspecified trimester: Secondary | ICD-10-CM

## 2019-10-26 NOTE — Progress Notes (Signed)
   TELEHEALTH OBSTETRICS VISIT ENCOUNTER NOTE  I connected with Marilyn Frey on 10/26/19 at  2:35 PM EDT by MyChart video chat at home and verified that I am speaking with the correct person using two identifiers.   I discussed the limitations, risks, security and privacy concerns of performing an evaluation and management service by telephone and the availability of in person appointments. I also discussed with the patient that there may be a patient responsible charge related to this service. The patient expressed understanding and agreed to proceed.  Subjective:  Marilyn Frey is a 39 y.o. G3P1102 at [redacted]w[redacted]d being followed for ongoing prenatal care.  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  Patient reports no complaints. Reports fetal movement. Denies any contractions, bleeding or leaking of fluid.   The following portions of the patient's history were reviewed and updated as appropriate: allergies, current medications, past family history, past medical history, past social history, past surgical history and problem list.   Objective:   General:  Alert, oriented and cooperative.   Mental Status: Normal mood and affect perceived. Normal judgment and thought content.  Rest of physical exam deferred due to type of encounter  Assessment and Plan:  Pregnancy: G3P1102 at [redacted]w[redacted]d 1. Supervision of high risk pregnancy, antepartum - continue routine prenatal care - discussed BC options, would like IUD at [facility] visit  2. Multigravida of advanced maternal age in third trimester  3. 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  I discussed the assessment and treatment plan with the patient. The patient was provided an opportunity to ask questions and all were answered. The patient agreed with the plan and demonstrated an understanding of the instructions. The patient was advised to call back or seek an in-person office evaluation/go to MAU at [facility] for any urgent or concerning symptoms. Please refer to After Visit Summary for other counseling recommendations.   I provided 7 minutes of non-face-to-face time during this encounter.  No follow-ups on file.  Future Appointments  Date Time Provider Department Center  10/26/2019  2:35 PM Delorise Jackson [facility]  11/05/2019 10:00 AM [facility] NURSE [facility]  11/05/2019 10:00 AM [facility] US1 [facility]    Vihaan Gloss L Aliana Kreischer, DO Center for [employer], [facility]

## 2019-10-26 NOTE — Progress Notes (Signed)
I connected with  [name] on 10/26/19 at  2:35 PM EDT by telephone and verified that I am speaking with the correct person using two identifiers.   I discussed the limitations, risks, security and privacy concerns of performing an evaluation and management service by telephone and the availability of in person appointments. I also discussed with the patient that there may be a patient responsible charge related to this service. The patient expressed understanding and agreed to proceed.  Ernestina Patches, CMA 10/26/2019  2:24 PM

## 2019-10-26 NOTE — Patient Instructions (Signed)

## 2019-11-05 ENCOUNTER — Ambulatory Visit: Payer: 59 | Admitting: *Deleted

## 2019-11-05 ENCOUNTER — Ambulatory Visit: Payer: 59 | Attending: Obstetrics and Gynecology

## 2019-11-05 ENCOUNTER — Other Ambulatory Visit: Payer: Self-pay

## 2019-11-05 DIAGNOSIS — Z362 Encounter for other antenatal screening follow-up: Secondary | ICD-10-CM | POA: Diagnosis not present

## 2019-11-05 DIAGNOSIS — O0990 Supervision of high risk pregnancy, unspecified, unspecified trimester: Secondary | ICD-10-CM

## 2019-11-05 DIAGNOSIS — Z148 Genetic carrier of other disease: Secondary | ICD-10-CM | POA: Diagnosis not present

## 2019-11-05 DIAGNOSIS — O09523 Supervision of elderly multigravida, third trimester: Secondary | ICD-10-CM | POA: Diagnosis present

## 2019-11-05 DIAGNOSIS — Z3A32 32 weeks gestation of pregnancy: Secondary | ICD-10-CM

## 2019-11-05 DIAGNOSIS — O09529 Supervision of elderly multigravida, unspecified trimester: Secondary | ICD-10-CM | POA: Insufficient documentation

## 2019-11-08 ENCOUNTER — Ambulatory Visit (INDEPENDENT_AMBULATORY_CARE_PROVIDER_SITE_OTHER): Payer: 59 | Admitting: Medical

## 2019-11-08 ENCOUNTER — Other Ambulatory Visit: Payer: Self-pay

## 2019-11-08 ENCOUNTER — Encounter: Payer: Self-pay | Admitting: Medical

## 2019-11-08 VITALS — BP 106/75 | HR 99 | Wt 207.4 lb

## 2019-11-08 DIAGNOSIS — O09523 Supervision of elderly multigravida, third trimester: Secondary | ICD-10-CM

## 2019-11-08 DIAGNOSIS — Z23 Encounter for immunization: Secondary | ICD-10-CM

## 2019-11-08 DIAGNOSIS — Z148 Genetic carrier of other disease: Secondary | ICD-10-CM

## 2019-11-08 DIAGNOSIS — O0993 Supervision of high risk pregnancy, unspecified, third trimester: Secondary | ICD-10-CM

## 2019-11-08 DIAGNOSIS — Z3A32 32 weeks gestation of pregnancy: Secondary | ICD-10-CM

## 2019-11-08 DIAGNOSIS — O0990 Supervision of high risk pregnancy, unspecified, unspecified trimester: Secondary | ICD-10-CM

## 2019-11-08 NOTE — Progress Notes (Signed)
   PRENATAL VISIT NOTE  Subjective:  Marilyn Frey is a 39 y.o. G3P1102 at [redacted]w[redacted]d being seen today for ongoing prenatal care.  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  Patient reports no complaints.  Contractions: Not present. Vag. Bleeding: None.  Movement: Present. Denies leaking of fluid.   The following portions of the patient's history were reviewed and updated as appropriate: allergies, current medications, past family history, past medical history, past social history, past surgical history and problem list.   Objective:   Vitals:   11/08/19 1020  BP: 106/75  Pulse: 99  Weight: 207 lb 6.4 oz (94.1 kg)    Fetal Status: Fetal Heart Rate (bpm): 143   Movement: Present     General:  Alert, oriented and cooperative. Patient is in no acute distress.  Skin: Skin is warm and dry. No rash noted.   Cardiovascular: Normal heart rate noted  Respiratory: Normal respiratory effort, no problems with respiration noted  Abdomen: Soft, gravid, appropriate for gestational age.  Pain/Pressure: Present     Pelvic: Cervical exam deferred        Extremities: Normal range of motion.  Edema: None  Mental Status: Normal mood and affect. Normal behavior. Normal judgment and thought content.   Assessment and Plan:  Pregnancy: G3P1102 at [redacted]w[redacted]d 1. Supervision of high risk pregnancy, antepartum - Doing well - Tdap today  - Normal third trimester labs discussed   2. Multigravida of advanced maternal age in third trimester - < 54 yo, no change in management   3. 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 Please refer to After Visit Summary for other counseling recommendations.   Return in about 2 weeks (around 11/22/2019) for LOB, any provider, Virtual.  No future appointments.  Vonzella Nipple, PA-C

## 2019-11-08 NOTE — Patient Instructions (Addendum)
Fetal Movement Counts Patient Name: ________________________________________________ Patient Due Date: ____________________ What is a fetal movement count?  A fetal movement count is the number of times that you feel your baby move during a certain amount of time. This may also be called a fetal kick count. A fetal movement count is recommended for every pregnant woman. You may be asked to start counting fetal movements as early as week 28 of your pregnancy. Pay attention to when your baby is most active. You may notice your baby's sleep and wake cycles. You may also notice things that make your baby move more. You should do a fetal movement count:  When your baby is normally most active.  At the same time each day. A good time to count movements is while you are resting, after having something to eat and drink. How do I count fetal movements? 1. Find a quiet, comfortable area. Sit, or lie down on your side. 2. Write down the date, the start time and stop time, and the number of movements that you felt between those two times. Take this information with you to your health care visits. 3. Write down your start time when you feel the first movement. 4. Count kicks, flutters, swishes, rolls, and jabs. You should feel at least 10 movements. 5. You may stop counting after you have felt 10 movements, or if you have been counting for 2 hours. Write down the stop time. 6. If you do not feel 10 movements in 2 hours, contact your health care provider for further instructions. Your health care provider may want to do additional tests to assess your baby's well-being. Contact a health care provider if:  You feel fewer than 10 movements in 2 hours.  Your baby is not moving like he or she usually does. Date: ____________ Start time: ____________ Stop time: ____________ Movements: ____________ Date: ____________ Start time: ____________ Stop time: ____________ Movements: ____________ Date: ____________  Start time: ____________ Stop time: ____________ Movements: ____________ Date: ____________ Start time: ____________ Stop time: ____________ Movements: ____________ Date: ____________ Start time: ____________ Stop time: ____________ Movements: ____________ Date: ____________ Start time: ____________ Stop time: ____________ Movements: ____________ Date: ____________ Start time: ____________ Stop time: ____________ Movements: ____________ Date: ____________ Start time: ____________ Stop time: ____________ Movements: ____________ Date: ____________ Start time: ____________ Stop time: ____________ Movements: ____________ This information is not intended to replace advice given to you by your health care provider. Make sure you discuss any questions you have with your health care provider. Document Revised: 12/03/2018 Document Reviewed: 12/03/2018 Elsevier Patient Education  2020 [employer] of the uterus can occur throughout pregnancy, but they are not always a sign that you are in labor. You may have practice contractions called Braxton Hicks contractions. These false labor contractions are sometimes confused with true labor. What are [place] contractions? Braxton Hicks contractions are tightening movements that occur in the muscles of the uterus before labor. Unlike true labor contractions, these contractions do not result in opening (dilation) and thinning of the cervix. Toward the end of pregnancy (32-34 weeks), Braxton Hicks contractions can happen more often and may become stronger. These contractions are sometimes difficult to tell apart from true labor because they can be very uncomfortable. You should not feel embarrassed if you go to the hospital with false labor. Sometimes, the only way to tell if you are in true labor is for your health care provider to look for changes in the cervix. The health care provider  will do a physical exam and may  monitor your contractions. If you are not in true labor, the exam should show that your cervix is not dilating and your water has not broken. If there are no other health problems associated with your pregnancy, it is completely safe for you to be sent home with false labor. You may continue to have Braxton Hicks contractions until you go into true labor. How to tell the difference between true labor and false labor True labor  Contractions last 30-70 seconds.  Contractions become very regular.  Discomfort is usually felt in the top of the uterus, and it spreads to the lower abdomen and low back.  Contractions do not go away with walking.  Contractions usually become more intense and increase in frequency.  The cervix dilates and gets thinner. False labor  Contractions are usually shorter and not as strong as true labor contractions.  Contractions are usually irregular.  Contractions are often felt in the front of the lower abdomen and in the groin.  Contractions may go away when you walk around or change positions while lying down.  Contractions get weaker and are shorter-lasting as time goes on.  The cervix usually does not dilate or become thin. Follow these instructions at home:   Take over-the-counter and prescription medicines only as told by your health care provider.  Keep up with your usual exercises and follow other instructions from your health care provider.  Eat and drink lightly if you think you are going into labor.  If Braxton Hicks contractions are making you uncomfortable: ? Change your position from lying down or resting to walking, or change from walking to resting. ? Sit and rest in a tub of warm water. ? Drink enough fluid to keep your urine pale yellow. Dehydration may cause these contractions. ? Do slow and deep breathing several times an hour.  Keep all follow-up prenatal visits as told by your health care provider. This is important. Contact a  health care provider if:  You have a fever.  You have continuous pain in your abdomen. Get help right away if:  Your contractions become stronger, more regular, and closer together.  You have fluid leaking or gushing from your vagina.  You pass blood-tinged mucus (bloody show).  You have bleeding from your vagina.  You have low back pain that you never had before.  You feel your baby's head pushing down and causing pelvic pressure.  Your baby is not moving inside you as much as it used to. Summary  Contractions that occur before labor are called Braxton Hicks contractions, false labor, or practice contractions.  Braxton Hicks contractions are usually shorter, weaker, farther apart, and less regular than true labor contractions. True labor contractions usually become progressively stronger and regular, and they become more frequent.  Manage discomfort from [facility] contractions by changing position, resting in a warm bath, drinking plenty of water, or practicing deep breathing. This information is not intended to replace advice given to you by your health care provider. Make sure you discuss any questions you have with your health care provider. Document Revised: 03/28/2017 Document Reviewed: 08/29/2016 Elsevier Patient Education  2020 [employer].  Call [facility] for a home kit for your partner @ [phone]

## 2019-11-08 NOTE — Addendum Note (Signed)
Addended by: Maxwell Marion E on: 11/08/2019 12:07 PM   Modules accepted: Orders

## 2019-11-22 ENCOUNTER — Telehealth (INDEPENDENT_AMBULATORY_CARE_PROVIDER_SITE_OTHER): Payer: 59 | Admitting: Obstetrics and Gynecology

## 2019-11-22 ENCOUNTER — Other Ambulatory Visit: Payer: Self-pay

## 2019-11-22 ENCOUNTER — Encounter: Payer: Self-pay | Admitting: Obstetrics and Gynecology

## 2019-11-22 VITALS — BP 130/70 | HR 87

## 2019-11-22 DIAGNOSIS — Z148 Genetic carrier of other disease: Secondary | ICD-10-CM

## 2019-11-22 DIAGNOSIS — O0990 Supervision of high risk pregnancy, unspecified, unspecified trimester: Secondary | ICD-10-CM

## 2019-11-22 DIAGNOSIS — O0993 Supervision of high risk pregnancy, unspecified, third trimester: Secondary | ICD-10-CM

## 2019-11-22 DIAGNOSIS — O09529 Supervision of elderly multigravida, unspecified trimester: Secondary | ICD-10-CM

## 2019-11-22 DIAGNOSIS — Z3A34 34 weeks gestation of pregnancy: Secondary | ICD-10-CM

## 2019-11-22 DIAGNOSIS — O09523 Supervision of elderly multigravida, third trimester: Secondary | ICD-10-CM

## 2019-11-22 NOTE — Progress Notes (Signed)
States has problems with her phone and can't do babyscripts. Reports bp at  Home  Have been normal. Took bp today during virtual visit.  Quirino Kakos,RN

## 2019-11-22 NOTE — Progress Notes (Signed)
I connected with  Marilyn Frey on 11/22/19 at 1:44pm by  virtually and verified that I am speaking with the correct person using two identifiers.   I discussed the limitations, risks, security and privacy concerns of performing an evaluation and management service by telephone/virtually and the availability of in person appointments. I also discussed with the patient that there may be a patient responsible charge related to this service. The patient expressed understanding and agreed to proceed.  Marilyn Gearin,RN 11/22/2019  1:45 PM

## 2019-11-22 NOTE — Progress Notes (Signed)
   MY CHART VIDEO VIRTUAL OBSTETRICS VISIT ENCOUNTER NOTE  I connected with Marilyn Frey on 11/22/19 at  1:35 PM EDT by My Chart video at home and verified that I am speaking with the correct person using two identifiers. Provider located at [employer] for [employer] at [place].   I discussed the limitations, risks, security and privacy concerns of performing an evaluation and management service by My Chart video and the availability of in person appointments. I also discussed with the patient that there may be a patient responsible charge related to this service. The patient expressed understanding and agreed to proceed.  Subjective:  Marilyn Frey is a 39 y.o. G3P1102 at [redacted]w[redacted]d being followed for ongoing prenatal care.  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  Patient reports no complaints. Reports fetal movement. Denies any contractions, bleeding or leaking of fluid.   The following portions of the patient's history were reviewed and updated as appropriate: allergies, current medications, past family history, past medical history, past social history, past surgical history and problem list.   Objective:   General:  Alert, oriented and cooperative.   Mental Status: Normal mood and affect perceived. Normal judgment and thought content.  Rest of physical exam deferred due to type of encounter  BP (!) 130/70   Pulse 87   LMP 03/25/2019 (Approximate)  **Done by patient's own at home BP cuff and scale  Assessment and Plan:  Pregnancy: G3P1102 at [redacted]w[redacted]d  1. Supervision of high risk pregnancy, antepartum - Anticipatory guidance of GBS testing and cervical exam at nv - Discussed importance of knowing GBS status prior to delivery - Reviewed risks of unmedicated GBS delivery: possible pneumonia,  septicemia and spinal menengitis  2. Antepartum multigravida of advanced maternal age  Preterm labor symptoms and general obstetric precautions including but not limited to vaginal bleeding, contractions, leaking of fluid and fetal movement were reviewed in detail with the patient.  I discussed the assessment and treatment plan with the patient. The patient was provided an opportunity to ask questions and all were answered. The patient agreed with the plan and demonstrated an understanding of the instructions. The patient was advised to call back or seek an in-person office evaluation/go to [facility]U at [facility] for any urgent or concerning symptoms. Please refer to After Visit Summary for other counseling recommendations.   I provided 5 minutes of non-face-to-face time during this encounter. There was 5 minutes of chart review time spent prior to this encounter. Total time spent = 10 minutes.  Return in about 2 weeks (around 12/06/2019) for Return OB w/GBS.  Future Appointments  Date Time Provider Department Center  12/07/2019 10:15 AM Currie Paris, NP [facility]  12/13/2019 10:15 AM Currie Paris, NP [facility]  12/20/2019 10:15 AM Mcneil Sober [facility]  12/27/2019 10:15 AM Gerrit Heck, CNM [facility]    Raelyn Mora, CNM Center for [employer], [facility]

## 2019-11-22 NOTE — Patient Instructions (Signed)

## 2019-11-29 ENCOUNTER — Encounter: Payer: 59 | Admitting: Nurse Practitioner

## 2019-12-07 ENCOUNTER — Ambulatory Visit (INDEPENDENT_AMBULATORY_CARE_PROVIDER_SITE_OTHER): Payer: 59 | Admitting: Nurse Practitioner

## 2019-12-07 ENCOUNTER — Other Ambulatory Visit (HOSPITAL_COMMUNITY)
Admission: RE | Admit: 2019-12-07 | Discharge: 2019-12-07 | Disposition: A | Discharge status: Home / Self Care | Payer: 59 | Source: Ambulatory Visit | Attending: Student | Admitting: Student

## 2019-12-07 ENCOUNTER — Other Ambulatory Visit: Payer: Self-pay

## 2019-12-07 ENCOUNTER — Encounter: Payer: 59 | Admitting: Student

## 2019-12-07 VITALS — BP 127/78 | HR 99 | Wt 204.0 lb

## 2019-12-07 DIAGNOSIS — O0990 Supervision of high risk pregnancy, unspecified, unspecified trimester: Secondary | ICD-10-CM | POA: Insufficient documentation

## 2019-12-07 NOTE — Progress Notes (Signed)
    Subjective:  Marilyn Frey is a 39 y.o. [phone] at [redacted]w[redacted]d being seen today for ongoing prenatal care.  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  Patient reports selling only on right ankle.  Contractions: Not present. Vag. Bleeding: None.  Movement: Present. Denies leaking of fluid.   The following portions of the patient's history were reviewed and updated as appropriate: allergies, current medications, past family history, past medical history, past social history, past surgical history and problem list. Problem list updated.  Objective:   Vitals:   12/07/19 1058  BP: 127/78  Pulse: 99  Weight: 204 lb (92.5 kg)    Fetal Status: Fetal Heart Rate (bpm): 143 Fundal Height: 37 cm Movement: Present  Presentation: Vertex  General:  Alert, oriented and cooperative. Patient is in no acute distress.  Skin: Skin is warm and dry. No rash noted.   Cardiovascular: Normal heart rate noted  Respiratory: Normal respiratory effort, no problems with respiration noted  Abdomen: Soft, gravid, appropriate for gestational age. Pain/Pressure: Present     Pelvic:  Cervical exam performed Vaginal swabs done Dilation: Fingertip Effacement (%): Thick Station: -3  Extremities: Normal range of motion.  Edema: Mild pitting, slight indentation  Mental Status: Normal mood and affect. Normal behavior. Normal judgment and thought content.   Urinalysis:      Assessment and Plan:  Pregnancy: G3P1102 at [redacted]w[redacted]d  1. Supervision of high risk pregnancy, antepartum Edema only on right ankle - baby is tilted more toward the right side Plans epidural Reviewed BPreading that would be high - client aware of 140/90 either reading - is checking BP at home but does not enter it into Babyscripts  - GC/Chlamydia probe amp ([facility])not at [facility] - Culture, beta strep (group b only)  Preterm labor symptoms and general obstetric precautions including but not limited to vaginal bleeding, contractions, leaking of fluid and fetal movement were reviewed in detail with the patient. Please refer to After Visit Summary for other counseling recommendations.  Return in about 1 week (around 12/14/2019) for virtual ROB.  Nolene Bernheim, RN, MSN, NP-BC Nurse Practitioner, [facility] for [employer], [facility] 12/07/2019 11:39 AM

## 2019-12-08 LAB — GC/CHLAMYDIA PROBE AMP (~~LOC~~) NOT AT ARMC
Chlamydia: NEGATIVE
Comment: NEGATIVE
Comment: NORMAL
Neisseria Gonorrhea: NEGATIVE

## 2019-12-10 LAB — CULTURE, BETA STREP (GROUP B ONLY): Strep Gp B Culture: NEGATIVE

## 2019-12-13 ENCOUNTER — Telehealth (INDEPENDENT_AMBULATORY_CARE_PROVIDER_SITE_OTHER): Payer: 59 | Admitting: Nurse Practitioner

## 2019-12-13 VITALS — BP 130/83 | HR 89

## 2019-12-13 DIAGNOSIS — Z3A37 37 weeks gestation of pregnancy: Secondary | ICD-10-CM

## 2019-12-13 DIAGNOSIS — O09523 Supervision of elderly multigravida, third trimester: Secondary | ICD-10-CM

## 2019-12-13 DIAGNOSIS — O0990 Supervision of high risk pregnancy, unspecified, unspecified trimester: Secondary | ICD-10-CM

## 2019-12-13 NOTE — Progress Notes (Signed)
I connected with  [name] on 12/13/19 at 10:15 AM EDT by telephone and verified that I am speaking with the correct person using two identifiers.   I discussed the limitations, risks, security and privacy concerns of performing an evaluation and management service by telephone and the availability of in person appointments. I also discussed with the patient that there may be a patient responsible charge related to this service. The patient expressed understanding and agreed to proceed.  Marylynn Pearson, RN 12/13/2019  10:53 AM

## 2019-12-13 NOTE — Progress Notes (Signed)
° °  OBSTETRICS PRENATAL VIRTUAL VISIT ENCOUNTER NOTE  Provider location: Center for [facility]   I connected with Marilyn Frey on 12/13/19 at 10:15 AM EDT by MyChart Video Encounter at home and verified that I am speaking with the correct person using two identifiers.   I discussed the limitations, risks, security and privacy concerns of performing an evaluation and management service virtually and the availability of in person appointments. I also discussed with the patient that there may be a patient responsible charge related to this service. The patient expressed understanding and agreed to proceed. Subjective:  Marilyn Frey is a 39 y.o. [phone] at [redacted]w[redacted]d being seen today for ongoing prenatal care.  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  Patient reports no complaints.  Contractions: Irritability. Vag. Bleeding: None.  Movement: Present. Denies any leaking of fluid.   The following portions of the patient's history were reviewed and updated as appropriate: allergies, current medications, past family history, past medical history, past social history, past surgical history and problem list.   Objective:   Vitals:   12/13/19 1054  BP: 130/83  Pulse: 89    Fetal Status:     Movement: Present     General:  Alert, oriented and cooperative. Patient is in no acute distress.  Respiratory: Normal respiratory effort, no problems with respiration noted  Mental Status: Normal mood and affect. Normal behavior. Normal judgment and thought content.  Rest of physical exam deferred due to type of encounter  Imaging: No results found.  Assessment and Plan:  Pregnancy: G3P1102 at [redacted]w[redacted]d 1. Supervision of high risk pregnancy, antepartum Need to check with lactation  consultant at next visit for evaluation of nipples to see if breast shells are needed.  Had flat nipples on initial prenatal exam. Baby moving well.  Few contractions.  2. Multigravida of advanced maternal age in third trimester   Term labor symptoms and general obstetric precautions including but not limited to vaginal bleeding, contractions, leaking of fluid and fetal movement were reviewed in detail with the patient. I discussed the assessment and treatment plan with the patient. The patient was provided an opportunity to ask questions and all were answered. The patient agreed with the plan and demonstrated an understanding of the instructions. The patient was advised to call back or seek an in-person office evaluation/go to MAU at [facility] for any urgent or concerning symptoms. Please refer to After Visit Summary for other counseling recommendations.   I provided 5 minutes of face-to-face time during this encounter.  Return for has appointment scheduled for 12-20-19 - needs to be in person.  Future Appointments  Date Time Provider Department Center  12/20/2019 10:15 AM Mcneil Sober [facility]  12/27/2019 10:15 AM Gerrit Heck, CNM [facility]    Currie Paris, NP Center for [employer], [facility]

## 2019-12-20 ENCOUNTER — Ambulatory Visit (INDEPENDENT_AMBULATORY_CARE_PROVIDER_SITE_OTHER): Payer: 59 | Admitting: Certified Nurse Midwife

## 2019-12-20 ENCOUNTER — Other Ambulatory Visit: Payer: Self-pay

## 2019-12-20 VITALS — BP 133/86 | HR 87 | Wt 211.2 lb

## 2019-12-20 DIAGNOSIS — Z3A39 39 weeks gestation of pregnancy: Secondary | ICD-10-CM

## 2019-12-20 DIAGNOSIS — Z3480 Encounter for supervision of other normal pregnancy, unspecified trimester: Secondary | ICD-10-CM

## 2019-12-20 NOTE — Progress Notes (Signed)
   PRENATAL VISIT NOTE  Subjective:  Marilyn Frey is a 39 y.o. G3P1102 at [redacted]w[redacted]d being seen today for ongoing prenatal care.  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  Patient reports no complaints.  Contractions: Irritability. Vag. Bleeding: None.  Movement: Present. Denies leaking of fluid.   The following portions of the patient's history were reviewed and updated as appropriate: allergies, current medications, past family history, past medical history, past social history, past surgical history and problem list.   Objective:   Vitals:   12/20/19 1018 12/20/19 1020  BP: (!) 125/92 133/86  Pulse: 98 87  Weight: 211 lb 3.2 oz (95.8 kg)     Fetal Status: Fetal Heart Rate (bpm): 160   Movement: Present     General:  Alert, oriented and cooperative. Patient is in no acute distress.  Skin: Skin is warm and dry. No rash noted.   Cardiovascular: Normal heart rate noted  Respiratory: Normal respiratory effort, no problems with respiration noted  Abdomen: Soft, gravid, appropriate for gestational age.  Pain/Pressure: Present     Pelvic: Cervical exam deferred        Extremities: Normal range of motion.  Edema: Trace  Mental Status: Normal mood and affect. Normal behavior. Normal judgment and thought content.   Assessment and Plan:  Pregnancy: G3P1102 at [redacted]w[redacted]d 1. Supervision of other normal pregnancy, antepartum  2. [redacted] weeks gestation of pregnancy - Anticipatory guidance given re: IOL at 41 weeks. Amenable to scheduling at next visit. - Encouraged staying out of the heat, hydration, and daily pelvic tilts in preparation for labor  Term labor symptoms and general obstetric precautions including but not limited to vaginal bleeding, contractions, leaking of fluid and fetal movement were reviewed in  detail with the patient. Please refer to After Visit Summary for other counseling recommendations.   Return in about 1 week (around 12/27/2019) for IN-PERSON, LOB.  Future Appointments  Date Time Provider Department Center  12/27/2019 10:15 AM Gerrit Heck, CNM [facility]   Edd Arbour, CNM, MSN, [facility] 12/20/19 11:46 AM

## 2019-12-20 NOTE — Patient Instructions (Signed)
First Stage of Labor °Labor is your body's natural process of moving your baby and other structures, including the placenta and umbilical cord, out of your uterus. There are three stages of labor. How long each stage lasts is different for every woman. But certain events happen during each stage that are the same for everyone. °· The first stage starts when true labor begins. This stage ends when your cervix, which is the opening from your uterus into your vagina, is completely open (dilated). °· The second stage begins when your cervix is fully dilated and you start pushing. This stage ends when your baby is born. °· The third stage is the delivery of the organ that nourished your baby during pregnancy (placenta). °First stage of labor °As your due date gets closer, you may start to notice certain physical changes that mean labor is going to start soon. You may feel that your baby has dropped lower into your pelvis. You may experience irregular, often painless, contractions that go away when you walk around or lie down (Braxton Hicks contractions). This is also called false labor. °The first stage of labor begins when you start having contractions that come at regular (evenly spaced) intervals and your cervix starts to get thinner and wider in preparation for your baby to pass through. Birth care providers measure the dilation of your cervix in centimeters (cm). One centimeter is a little less than one-half of an inch. The first stage ends when your cervix is dilated to 10 cm. The first stage of labor is divided into three phases: °· Early phase. °· Active phase. °· Transitional phase. °The length of the first stage of labor varies. It may be longer if this is your first pregnancy. You may spend most of this stage at home trying to relax and stay comfortable. °How does this affect me? °During the first stage of labor, you will move through three phases. °What happens in the early phase? °· You will start to have  regular contractions that last 30-60 seconds. Contractions may come every 5-20 minutes. Keep track of your contractions and call your birth care provider. °· Your water may break during this phase. °· You may notice a clear or slightly bloody discharge of mucus (mucus plug) from your vagina. °· Your cervix will dilate to 3-6 cm. °What happens in the active phase? °The active phase usually lasts 3-5 hours. You may go to the hospital or birth center around this time. During the active phase: °· Your contractions will become stronger, longer, and more uncomfortable. °· Your contractions may last 45-90 seconds and come every 3-5 minutes. °· You may feel lower back pain. °· Your birth care providers may examine your cervix and feel your belly to find the position of your baby. °· You may have a monitor strapped to your belly to measure your contractions and your baby's heart rate. °· You may start using your pain management options. °· Your cervix may be dilated to 6 cm and may start to dilate more quickly. °What happens in the transitional phase? °The transitional phase typically lasts from 30 minutes to 2 hours. At the end of this phase, your cervix will be fully dilated to 10 cm. During the transitional phase: °· Contractions will get stronger and longer. °· Contractions may last 60-90 seconds and come less than 2 minutes apart. °· You may feel hot flashes, chills, or nausea. °How does this affect my baby? °During the first stage of labor, your baby will   gradually move down into your birth canal. °Follow these instructions at home and in the hospital or birth center: ° °· When labor first begins, try to stay calm. You are still in the early phase. If it is night, try to get some sleep. If it is day, try to relax and save your energy. You may want to make some calls and get ready to go to the hospital or birth center. °· When you are in the early phase, try these methods to help ease discomfort: °? Deep breathing and  muscle relaxation. °? Taking a walk. °? Taking a warm bath or shower. °· Drink some fluids and have a light snack if you feel like it. °· Keep track of your contractions. °· Based on the plan you created with your birth care provider, call when your contractions indicate it is time. °· If your water breaks, note the time, color, and odor of the fluid. °· When you are in the active phase, do your breathing exercises and rely on your support people and your team of birth care providers. °Contact a health care provider if: °· Your contractions are strong and regular. °· You have lower back pain or cramping. °· Your water breaks. °· You lose your mucus plug. °Get help right away if you: °· Have a severe headache that does not go away. °· Have changes in your vision. °· Have severe pain in your upper belly. °· Do not feel the baby move. °· Have bright red bleeding. °Summary °· The first stage of labor starts when true labor begins, and it ends when your cervix is dilated to 10 cm. °· The first stage of labor has three phases: early, active, and transitional. °· Your baby moves into the birth canal during the first stage of labor. °· You may have contractions that become stronger and longer. You may also lose your mucus plug and have your water break. °· Call your birth care provider when your contractions are frequent and strong enough to go to the hospital or birth center. °This information is not intended to replace advice given to you by your health care provider. Make sure you discuss any questions you have with your health care provider. °Document Revised: 08/06/2018 Document Reviewed: 06/29/2017 °Elsevier Patient Education © 2020 [employer] ° °

## 2019-12-27 ENCOUNTER — Ambulatory Visit (INDEPENDENT_AMBULATORY_CARE_PROVIDER_SITE_OTHER): Payer: 59

## 2019-12-27 ENCOUNTER — Other Ambulatory Visit: Payer: Self-pay

## 2019-12-27 ENCOUNTER — Telehealth (HOSPITAL_COMMUNITY): Payer: Self-pay | Admitting: *Deleted

## 2019-12-27 VITALS — BP 136/88 | HR 94 | Wt 216.1 lb

## 2019-12-27 DIAGNOSIS — O163 Unspecified maternal hypertension, third trimester: Secondary | ICD-10-CM

## 2019-12-27 DIAGNOSIS — O09523 Supervision of elderly multigravida, third trimester: Secondary | ICD-10-CM

## 2019-12-27 DIAGNOSIS — O0990 Supervision of high risk pregnancy, unspecified, unspecified trimester: Secondary | ICD-10-CM

## 2019-12-27 DIAGNOSIS — Z3A39 39 weeks gestation of pregnancy: Secondary | ICD-10-CM

## 2019-12-27 LAB — PROTEIN / CREATININE RATIO, URINE
Creatinine, Urine: 29.2 mg/dL
Protein, Ur: 6.3 mg/dL
Protein/Creat Ratio: 216 mg/g creat — ABNORMAL HIGH (ref 0–200)

## 2019-12-27 LAB — COMPREHENSIVE METABOLIC PANEL
ALT: 8 IU/L (ref 0–32)
AST: 15 IU/L (ref 0–40)
Albumin/Globulin Ratio: 1.2 (ref 1.2–2.2)
Albumin: 3 g/dL — ABNORMAL LOW (ref 3.8–4.8)
Alkaline Phosphatase: 212 IU/L — ABNORMAL HIGH (ref 48–121)
BUN/Creatinine Ratio: 7 — ABNORMAL LOW (ref 9–23)
BUN: 4 mg/dL — ABNORMAL LOW (ref 6–20)
Bilirubin Total: 0.4 mg/dL (ref 0.0–1.2)
CO2: 20 mmol/L (ref 20–29)
Calcium: 8.5 mg/dL — ABNORMAL LOW (ref 8.7–10.2)
Chloride: 105 mmol/L (ref 96–106)
Creatinine, Ser: 0.56 mg/dL — ABNORMAL LOW (ref 0.57–1.00)
GFR calc Af Amer: 136 mL/min/{1.73_m2} (ref >59)
GFR calc non Af Amer: 118 mL/min/{1.73_m2} (ref >59)
Globulin, Total: 2.5 g/dL (ref 1.5–4.5)
Glucose: 75 mg/dL (ref 65–99)
Potassium: 3.8 mmol/L (ref 3.5–5.2)
Sodium: 137 mmol/L (ref 134–144)
Total Protein: 5.5 g/dL — ABNORMAL LOW (ref 6.0–8.5)

## 2019-12-27 LAB — CBC
Hematocrit: 31.3 % — ABNORMAL LOW (ref 34.0–46.6)
Hemoglobin: 10.6 g/dL — ABNORMAL LOW (ref 11.1–15.9)
MCH: 30.4 pg (ref 26.6–33.0)
MCHC: 33.9 g/dL (ref 31.5–35.7)
MCV: 90 fL (ref 79–97)
Platelets: 189 10*3/uL (ref 150–450)
RBC: 3.49 x10E6/uL — ABNORMAL LOW (ref 3.77–5.28)
RDW: 12.7 % (ref 11.7–15.4)
WBC: 6.8 10*3/uL (ref 3.4–10.8)

## 2019-12-27 NOTE — Telephone Encounter (Signed)
Preadmission screen  

## 2019-12-27 NOTE — Patient Instructions (Signed)
Preeclampsia and Eclampsia Preeclampsia is a serious condition that may develop during pregnancy. This condition causes high blood pressure and increased protein in your urine along with other symptoms, such as headaches and vision changes. These symptoms may develop as the condition gets worse. Preeclampsia may occur at 20 weeks of pregnancy or later. Diagnosing and treating preeclampsia early is very important. If not treated early, it can cause serious problems for you and your baby. One problem it can lead to is eclampsia. Eclampsia is a condition that causes muscle jerking or shaking (convulsions or seizures) and other serious problems for the mother. During pregnancy, delivering your baby may be the best treatment for preeclampsia or eclampsia. For most women, preeclampsia and eclampsia symptoms go away after giving birth. In rare cases, a woman may develop preeclampsia after giving birth (postpartum preeclampsia). This usually occurs within 48 hours after childbirth but may occur up to 6 weeks after giving birth. What are the causes? The cause of preeclampsia is not known. What increases the risk? The following risk factors make you more likely to develop preeclampsia:  Being pregnant for the first time.  Having had preeclampsia during a past pregnancy.  Having a family history of preeclampsia.  Having high blood pressure.  Being pregnant with more than one baby.  Being 35 or older.  Being African-American.  Having kidney disease or diabetes.  Having medical conditions such as lupus or blood diseases.  Being very overweight (obese). What are the signs or symptoms? The most common symptoms are:  Severe headaches.  Vision problems, such as blurred or double vision.  Abdominal pain, especially upper abdominal pain. Other symptoms that may develop as the condition gets worse include:  Sudden weight gain.  Sudden swelling of the hands, face, legs, and feet.  Severe nausea  and vomiting.  Numbness in the face, arms, legs, and feet.  Dizziness.  Urinating less than usual.  Slurred speech.  Convulsions or seizures. How is this diagnosed? There are no screening tests for preeclampsia. Your health care provider will ask you about symptoms and check for signs of preeclampsia during your prenatal visits. You may also have tests that include:  Checking your blood pressure.  Urine tests to check for protein. Your health care provider will check for this at every prenatal visit.  Blood tests.  Monitoring your baby's heart rate.  Ultrasound. How is this treated? You and your health care provider will determine the treatment approach that is best for you. Treatment may include:  Having more frequent prenatal exams to check for signs of preeclampsia, if you have an increased risk for preeclampsia.  Medicine to lower your blood pressure.  Staying in the hospital, if your condition is severe. There, treatment will focus on controlling your blood pressure and the amount of fluids in your body (fluid retention).  Taking medicine (magnesium sulfate) to prevent seizures. This may be given as an injection or through an IV.  Taking a low-dose aspirin during your pregnancy.  Delivering your baby early. You may have your labor started with medicine (induced), or you may have a cesarean delivery. Follow these instructions at home: Eating and drinking   Drink enough fluid to keep your urine pale yellow.  Avoid caffeine. Lifestyle  Do not use any products that contain nicotine or tobacco, such as cigarettes and e-cigarettes. If you need help quitting, ask your health care provider.  Do not use alcohol or drugs.  Avoid stress as much as possible. Rest and get   plenty of sleep. General instructions  Take over-the-counter and prescription medicines only as told by your health care provider.  When lying down, lie on your left side. This keeps pressure off your  major blood vessels.  When sitting or lying down, raise (elevate) your feet. Try putting some pillows underneath your lower legs.  Exercise regularly. Ask your health care provider what kinds of exercise are best for you.  Keep all follow-up and prenatal visits as told by your health care provider. This is important. How is this prevented? There is no known way of preventing preeclampsia or eclampsia from developing. However, to lower your risk of complications and detect problems early:  Get regular prenatal care. Your health care provider may be able to diagnose and treat the condition early.  Maintain a healthy weight. Ask your health care provider for help managing weight gain during pregnancy.  Work with your health care provider to manage any long-term (chronic) health conditions you have, such as diabetes or kidney problems.  You may have tests of your blood pressure and kidney function after giving birth.  Your health care provider may have you take low-dose aspirin during your next pregnancy. Contact a health care provider if:  You have symptoms that your health care provider told you may require more treatment or monitoring, such as: ? Headaches. ? Nausea or vomiting. ? Abdominal pain. ? Dizziness. ? Light-headedness. Get help right away if:  You have severe: ? Abdominal pain. ? Headaches that do not get better. ? Dizziness. ? Vision problems. ? Confusion. ? Nausea or vomiting.  You have any of the following: ? A seizure. ? Sudden, rapid weight gain. ? Sudden swelling in your hands, ankles, or face. ? Trouble moving any part of your body. ? Numbness in any part of your body. ? Trouble speaking. ? Abnormal bleeding.  You faint. Summary  Preeclampsia is a serious condition that may develop during pregnancy.  This condition causes high blood pressure and increased protein in your urine along with other symptoms, such as headaches and vision  changes.  Diagnosing and treating preeclampsia early is very important. If not treated early, it can cause serious problems for you and your baby.  Get help right away if you have symptoms that your health care provider told you to watch for. This information is not intended to replace advice given to you by your health care provider. Make sure you discuss any questions you have with your health care provider. Document Revised: 12/16/2017 Document Reviewed: 11/20/2015 Elsevier Patient Education  2020 Elsevier Inc.  

## 2019-12-27 NOTE — Progress Notes (Signed)
HIGH-RISK PREGNANCY OFFICE VISIT  Patient name: Marilyn Frey MRN [id number]  Date of birth: [birth year] Chief Complaint:   Routine Prenatal Visit  Subjective:   Marilyn Frey is a 39 y.o. [id number] female at 33w4dwith an Estimated Date of Delivery: 12/30/19 being seen today for ongoing management of a high-risk pregnancy aeb has Supervision of high risk pregnancy, antepartum; AMA (advanced maternal age) multigravida 35+; and Genetic carrier status on their problem list.  Patient presents today without complaints. Patient endorses fetal movement and denies vaginal concerns including abnormal discharge, leaking of fluid, and bleeding. Patient denies abdominal cramping and reports some occasional contractions. She reports some edema of her right leg, but denies pain, numbness, or abnormal warmth/bruising. Patient states she has been experiencing this swelling for "a couple weeks now."   Contractions: Irritability. Vag. Bleeding: None.  Movement: Present.  Reviewed past medical,surgical, social, obstetrical and family history as well as problem list, medications and allergies.  Objective   Vitals:   12/27/19 1021 12/27/19 1024  BP: (!) 146/86 136/88  Pulse: 80 94  Weight: 216 lb 1.6 oz (98 kg)   Body mass index is 34.88 kg/m.  Total Weight Gain:49 lb 1.6 oz (22.3 kg)         Physical Examination:   General appearance: Well appearing, and in no distress  Mental status: Alert, oriented to person, place, and time  Skin: Warm & dry  Cardiovascular: Normal heart rate noted  Respiratory: Normal respiratory effort, no distress  Abdomen: Soft, gravid, nontender, AGA with Fundal height of Fundal Height: 39 cm  Pelvic: Cervical exam performed  Dilation: Fingertip Effacement (%): 50 Station: Ballotable Presentation: Undeterminable  Extremities: Edema: Trace  Fetal Status: Fetal Heart Rate (bpm): 142  Movement: Present   No results found for this or any previous visit (from the past 24  hour(s)).  Assessment & Plan:  Low-risk pregnancy of a 39y.o., [id number] 347w4dith an Estimated Date of Delivery: 12/30/19  1. Supervision of high risk pregnancy, antepartum -Cervical exam discussed. -Discussed notable edema of right leg.  Encouraged to monitor and report any decrease sensation or onset of pain. -Anticipating female child for circumcision.   2. Multigravida of advanced maternal age in third trimester -Patient does not desire more children. -Requests IUD for birth control method.   3. Elevated blood pressure complicating pregnancy in third trimester, antepartum -BP today elevated (Stage 2) with repeat also elevated (Stage 2). -Review of vitals shows additional elevations, with one at [redacted] weeks GA which is questionable for chronic state. -Discussed IOL s/t elevations and AMA status.  Patient agreeable but requests to be induced on Thursday or Friday d/t home obligations (older children requiring child care coordination). -Provider informs patient that this is understandable and can be accommodated as long as no significant changes in health status. -Will collect PreE labs stat and patient informed that if abnormal she is to report for induction today. -Instructed to take blood pressure daily and report any elevations as well as decreased fetal movement, new onset headaches or vision changes, and SOB.  -Patient verbalized understanding without further questions or concerns. -IOL scheduled while provider at bedside for Sept 2, 2021 for AM.    Meds: No orders of the defined types were placed in this encounter.  Labs/procedures today:   Lab Orders     Comp Met (CMET)     Protein / creatinine ratio, urine     CBC   Reviewed: Term labor symptoms and  general obstetric precautions including but not limited to vaginal bleeding, contractions, leaking of fluid and fetal movement were reviewed in detail with the patient.  All questions were answered.  Follow-up: Return for  Postpartum.  Orders Placed This Encounter  Procedures  . Comp Met (CMET)  . Protein / creatinine ratio, urine  . [place] Connelly [place] MSN, CNM 12/27/2019

## 2019-12-28 ENCOUNTER — Other Ambulatory Visit (HOSPITAL_COMMUNITY)
Admission: RE | Admit: 2019-12-28 | Discharge: 2019-12-28 | Disposition: A | Discharge status: Home / Self Care | Payer: 59 | Source: Ambulatory Visit | Attending: Family Medicine | Admitting: Family Medicine

## 2019-12-28 DIAGNOSIS — Z20822 Contact with and (suspected) exposure to covid-19: Secondary | ICD-10-CM | POA: Insufficient documentation

## 2019-12-28 DIAGNOSIS — Z01812 Encounter for preprocedural laboratory examination: Secondary | ICD-10-CM | POA: Insufficient documentation

## 2019-12-28 LAB — SARS CORONAVIRUS 2 (TAT 6-24 HRS): SARS Coronavirus 2: NEGATIVE

## 2019-12-29 ENCOUNTER — Other Ambulatory Visit: Payer: Self-pay | Admitting: Advanced Practice Midwife

## 2019-12-30 ENCOUNTER — Other Ambulatory Visit: Payer: Self-pay

## 2019-12-30 ENCOUNTER — Encounter (HOSPITAL_COMMUNITY): Payer: Self-pay | Admitting: Obstetrics and Gynecology

## 2019-12-30 ENCOUNTER — Inpatient Hospital Stay (HOSPITAL_COMMUNITY): Payer: 59

## 2019-12-30 ENCOUNTER — Inpatient Hospital Stay (HOSPITAL_COMMUNITY): Payer: 59 | Admitting: Anesthesiology

## 2019-12-30 ENCOUNTER — Inpatient Hospital Stay (HOSPITAL_COMMUNITY)
Admission: RE | Admit: 2019-12-30 | Discharge: 2020-01-01 | DRG: 807 | Disposition: A | Discharge status: Home / Self Care | Payer: 59 | Attending: Obstetrics and Gynecology | Admitting: Obstetrics and Gynecology

## 2019-12-30 DIAGNOSIS — O134 Gestational [pregnancy-induced] hypertension without significant proteinuria, complicating childbirth: Secondary | ICD-10-CM | POA: Diagnosis present

## 2019-12-30 DIAGNOSIS — Z20822 Contact with and (suspected) exposure to covid-19: Secondary | ICD-10-CM | POA: Diagnosis present

## 2019-12-30 DIAGNOSIS — O139 Gestational [pregnancy-induced] hypertension without significant proteinuria, unspecified trimester: Secondary | ICD-10-CM | POA: Diagnosis present

## 2019-12-30 DIAGNOSIS — Z3A40 40 weeks gestation of pregnancy: Secondary | ICD-10-CM

## 2019-12-30 DIAGNOSIS — Z148 Genetic carrier of other disease: Secondary | ICD-10-CM | POA: Diagnosis not present

## 2019-12-30 DIAGNOSIS — Z01812 Encounter for preprocedural laboratory examination: Secondary | ICD-10-CM

## 2019-12-30 DIAGNOSIS — O99214 Obesity complicating childbirth: Secondary | ICD-10-CM | POA: Diagnosis present

## 2019-12-30 LAB — PROTEIN / CREATININE RATIO, URINE
Creatinine, Urine: 16.69 mg/dL
Total Protein, Urine: <6 mg/dL

## 2019-12-30 LAB — CBC
HCT: 32 % — ABNORMAL LOW (ref 36.0–46.0)
Hemoglobin: 10.2 g/dL — ABNORMAL LOW (ref 12.0–15.0)
MCH: 29.4 pg (ref 26.0–34.0)
MCHC: 31.9 g/dL (ref 30.0–36.0)
MCV: 92.2 fL (ref 80.0–100.0)
Platelets: 211 10*3/uL (ref 150–400)
RBC: 3.47 MIL/uL — ABNORMAL LOW (ref 3.87–5.11)
RDW: 13 % (ref 11.5–15.5)
WBC: 6.7 10*3/uL (ref 4.0–10.5)
nRBC: 0 % (ref 0.0–0.2)

## 2019-12-30 LAB — COMPREHENSIVE METABOLIC PANEL
ALT: 8 U/L (ref 0–44)
AST: 26 U/L (ref 15–41)
Albumin: 2.3 g/dL — ABNORMAL LOW (ref 3.5–5.0)
Alkaline Phosphatase: 150 U/L — ABNORMAL HIGH (ref 38–126)
Anion gap: 8 (ref 5–15)
BUN: <5 mg/dL — ABNORMAL LOW (ref 6–20)
CO2: 22 mmol/L (ref 22–32)
Calcium: 8.3 mg/dL — ABNORMAL LOW (ref 8.9–10.3)
Chloride: 106 mmol/L (ref 98–111)
Creatinine, Ser: 0.63 mg/dL (ref 0.44–1.00)
GFR calc Af Amer: >60 mL/min (ref >60)
GFR calc non Af Amer: >60 mL/min (ref >60)
Glucose, Bld: 94 mg/dL (ref 70–99)
Potassium: 4.7 mmol/L (ref 3.5–5.1)
Sodium: 136 mmol/L (ref 135–145)
Total Bilirubin: 1.2 mg/dL (ref 0.3–1.2)
Total Protein: 5 g/dL — ABNORMAL LOW (ref 6.5–8.1)

## 2019-12-30 LAB — RPR: RPR Ser Ql: NONREACTIVE

## 2019-12-30 LAB — TYPE AND SCREEN
ABO/RH(D): O POS
Antibody Screen: NEGATIVE

## 2019-12-30 MED ORDER — PHENYLEPHRINE 40 MCG/ML (10ML) SYRINGE FOR IV PUSH (FOR BLOOD PRESSURE SUPPORT): 80.0000 ug | PREFILLED_SYRINGE | INTRAVENOUS | Status: DC | PRN

## 2019-12-30 MED ORDER — LIDOCAINE HCL (PF) 1 % IJ SOLN
INTRAMUSCULAR | Status: DC | PRN
  Administered 2019-12-30: 6 mL via EPIDURAL

## 2019-12-30 MED ORDER — OXYCODONE-ACETAMINOPHEN 5-325 MG PO TABS: 2.0000 | ORAL_TABLET | ORAL | Status: DC | PRN

## 2019-12-30 MED ORDER — LIDOCAINE HCL (PF) 1 % IJ SOLN: 30.0000 mL | INTRAMUSCULAR | Status: DC | PRN

## 2019-12-30 MED ORDER — SODIUM CHLORIDE (PF) 0.9 % IJ SOLN
INTRAMUSCULAR | Status: DC | PRN
  Administered 2019-12-30: 12 mL/h via EPIDURAL

## 2019-12-30 MED ORDER — BUPIVACAINE HCL (PF) 0.25 % IJ SOLN
INTRAMUSCULAR | Status: DC | PRN
  Administered 2019-12-30: 8 mL via EPIDURAL

## 2019-12-30 MED ORDER — ONDANSETRON HCL 4 MG/2ML IJ SOLN
4.0000 mg | Freq: Four times a day (QID) | INTRAMUSCULAR | Status: DC | PRN
  Administered 2019-12-30: 4 mg via INTRAVENOUS
  Filled 2019-12-30: qty 2

## 2019-12-30 MED ORDER — LACTATED RINGERS IV SOLN
500.0000 mL | Freq: Once | INTRAVENOUS | Status: AC
  Administered 2019-12-30: 500 mL via INTRAVENOUS

## 2019-12-30 MED ORDER — TERBUTALINE SULFATE 1 MG/ML IJ SOLN: 0.2500 mg | Freq: Once | INTRAMUSCULAR | Status: DC | PRN

## 2019-12-30 MED ORDER — LACTATED RINGERS IV SOLN: INTRAVENOUS | Status: DC

## 2019-12-30 MED ORDER — OXYTOCIN-SODIUM CHLORIDE 30-0.9 UT/500ML-% IV SOLN
1.0000 m[IU]/min | INTRAVENOUS | Status: DC
  Administered 2019-12-30: 2 m[IU]/min via INTRAVENOUS
  Filled 2019-12-30: qty 500

## 2019-12-30 MED ORDER — DIPHENHYDRAMINE HCL 50 MG/ML IJ SOLN: 12.5000 mg | INTRAMUSCULAR | Status: DC | PRN

## 2019-12-30 MED ORDER — FENTANYL-BUPIVACAINE-NACL 0.5-0.125-0.9 MG/250ML-% EP SOLN: 12.0000 mL/h | EPIDURAL | Status: DC | PRN

## 2019-12-30 MED ORDER — LACTATED RINGERS IV SOLN: 500.0000 mL | INTRAVENOUS | Status: DC | PRN

## 2019-12-30 MED ORDER — OXYTOCIN-SODIUM CHLORIDE 30-0.9 UT/500ML-% IV SOLN
2.5000 [IU]/h | INTRAVENOUS | Status: DC
  Filled 2019-12-30: qty 500

## 2019-12-30 MED ORDER — EPHEDRINE 5 MG/ML INJ: 10.0000 mg | INTRAVENOUS | Status: DC | PRN

## 2019-12-30 MED ORDER — OXYTOCIN BOLUS FROM INFUSION
333.0000 mL | Freq: Once | INTRAVENOUS | Status: AC
  Administered 2019-12-30: 333 mL via INTRAVENOUS

## 2019-12-30 MED ORDER — MISOPROSTOL 25 MCG QUARTER TABLET
25.0000 ug | ORAL_TABLET | ORAL | Status: DC | PRN
  Filled 2019-12-30: qty 1

## 2019-12-30 MED ORDER — FENTANYL-BUPIVACAINE-NACL 0.5-0.125-0.9 MG/250ML-% EP SOLN
12.0000 mL/h | EPIDURAL | Status: DC | PRN
  Filled 2019-12-30: qty 250

## 2019-12-30 MED ORDER — FENTANYL CITRATE (PF) 100 MCG/2ML IJ SOLN
INTRAMUSCULAR | Status: DC | PRN
Start: 2019-12-30 — End: 2019-12-30
  Administered 2019-12-30: 100 ug via EPIDURAL

## 2019-12-30 MED ORDER — FENTANYL CITRATE (PF) 100 MCG/2ML IJ SOLN
100.0000 ug | INTRAMUSCULAR | Status: DC | PRN
  Filled 2019-12-30: qty 2

## 2019-12-30 MED ORDER — OXYCODONE-ACETAMINOPHEN 5-325 MG PO TABS: 1.0000 | ORAL_TABLET | ORAL | Status: DC | PRN

## 2019-12-30 MED ORDER — ACETAMINOPHEN 325 MG PO TABS: 650.0000 mg | ORAL_TABLET | ORAL | Status: DC | PRN

## 2019-12-30 MED ORDER — SOD CITRATE-CITRIC ACID 500-334 MG/5ML PO SOLN: 30.0000 mL | ORAL | Status: DC | PRN

## 2019-12-30 NOTE — Progress Notes (Signed)
Labor Progress Note Marilyn Frey is a 39 y.o. G3P1102 at [redacted]w[redacted]d presented for IOL for gHTN S: Patient has no complaints  O:  BP 123/65   Pulse 75   Temp 98.9 F (37.2 C) (Axillary)   Resp 18   Ht 5\' 6"  (1.676 m)   Wt 98.5 kg   LMP 03/25/2019 (Approximate)   BMI 35.04 kg/m  EFM: baseline 130/moderate variability/+accels, no decels  CVE: Dilation: 4 Effacement (%): 50 Cervical Position: Middle Station: -2, ballotable Presentation: Vertex Exam by: Dr. 03/27/2019    A&P: 39 y.o. 24 [redacted]w[redacted]d IOL for gHTN #Labor: Pit started @0908 , now at 78mL/hr. Consider AROM at next check if fetal head well applied.  #Pain: Epidural  #FWB: Cat I #GBS negative #gHTN: stable, 120's systolic. 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

## 2019-12-30 NOTE — Progress Notes (Signed)
Patient Vitals for the past 4 hrs:  BP Temp Temp src Pulse Resp  12/30/19 2050 128/82 98.4 F (36.9 C) Oral 84 18  12/30/19 2030 114/77 -- -- (!) 101 18  12/30/19 2000 122/63 -- -- 83 --  12/30/19 1930 117/66 98.3 F (36.8 C) Oral 74 17  12/30/19 1901 132/79 -- -- 73 18  12/30/19 1830 125/77 -- -- 77 18  12/30/19 1800 112/68 -- -- 74 17  12/30/19 1732 110/63 98.7 F (37.1 C) Oral 81 16  12/30/19 1700 134/80 -- -- 76 16   Called to room by RN after noting bright red vaginal bleeding when she turned to pt to a different side  Cx now 7/90/-2, SROM noted w/clear fluid.  Mod amt bleeding, none active at the moment. Pt cleaned, new pad placed.  FHR Cat 1, ctx q 2 minutes.  Dr Macon Large given update. Hopeful for SVD soon.

## 2019-12-30 NOTE — Progress Notes (Addendum)
Labor Progress Note Marilyn Frey is a 39 y.o. G3P1102 at [redacted]w[redacted]d presented for IOL for gHTN S: Patient is doing well. She is comfortable with epidural. No complaints at this time.  O:  BP 122/73   Pulse 80   Temp 98.2 F (36.8 C) (Oral)   Resp 17   Ht 5\' 6"  (1.676 m)   Wt 98.5 kg   LMP 03/25/2019 (Approximate)   BMI 35.04 kg/m  EFM: baseline 130/moderate variability/+accels, some mild variable decels Toco: Ctx q2-3 mins  CVE: Dilation: 4 Effacement (%): 50 Cervical Position: Middle Station: -3 Presentation: Vertex Exam by:: [email or web address], RN [name], RN   A&P: 39 y.o. [phone] [redacted]w[redacted]d IOL for gHTN #Labor: Progressing well. On Pit @8mL /hr, next SVE in 3h. #Pain: Epidural  #FWB: Cat II #GBS negative #gHTN: Elevated BP at 140/80 most recently. BP otherwise has been stable, 120-130's systolic. 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 I agree with the findings and the plan of care as documented in the resident's note.  , MD OB Fellow, Faculty [facility], [facility] 12/30/2019 1:34 PM

## 2019-12-30 NOTE — Anesthesia Procedure Notes (Signed)
Epidural Patient location during procedure: OB Start time: 12/30/2019 10:46 AM End time: 12/30/2019 10:52 AM  Staffing Anesthesiologist: Bethena Midget, MD  Preanesthetic Checklist Completed: patient identified, IV checked, site marked, risks and benefits discussed, surgical consent, monitors and equipment checked, pre-op evaluation and timeout performed  Epidural Patient position: sitting Prep: DuraPrep and site prepped and draped Patient monitoring: continuous pulse ox and blood pressure Approach: midline Location: L3-L4 Injection technique: LOR air  Needle:  Needle type: Tuohy  Needle gauge: 17 G Needle length: 9 cm and 9 Needle insertion depth: 7 cm Catheter type: closed end flexible Catheter size: 19 Gauge Catheter at skin depth: 12 cm Test dose: negative  Assessment Events: blood not aspirated, injection not painful, no injection resistance, no paresthesia and negative IV test

## 2019-12-30 NOTE — Discharge Summary (Signed)
Postpartum Discharge Summary    Patient Name: Marilyn Frey DOB: [birth year] MRN: [id number]  Date of admission: 12/30/2019 Delivery date:12/30/2019  Delivering provider: Christin Fudge  Date of discharge: 01/01/2020  Admitting diagnosis: Gestational hypertension [O13.9] Intrauterine pregnancy: [redacted]w[redacted]d    Secondary diagnosis:  Active Problems:   Gestational hypertension  Additional problems: None    Discharge diagnosis: Term Pregnancy Delivered                                              Post partum procedures:none Augmentation: Pitocin Complications: None  Hospital course: Induction of Labor With Vaginal Delivery   39y.o. yo [id number] [id number] admitted to the hospital 12/30/2019 for induction of labor.  Indication for induction: Gestational hypertension.  Patient had an uncomplicated labor course as follows: Membrane Rupture Time/Date: 8:28 PM ,12/30/2019   Delivery Method:Vaginal, Spontaneous  Episiotomy: None  Lacerations:  1st degree;Vaginal  Details of delivery can be found in separate delivery note.  Patient had a routine postpartum course. Patient is discharged home 01/01/20.  Newborn Data: Birth date:12/30/2019  Birth time:10:41 PM  Gender:Female  Living status:Living  Apgars:7 ,9  Weight:3890 g   Magnesium Sulfate received: No BMZ received: No Rhophylac:N/A MMR:N/A T-DaP:Given prenatally Flu: Yes Transfusion:No  Physical exam  Vitals:   12/31/19 1015 12/31/19 1410 12/31/19 2141 01/01/20 0507  BP: 122/80 134/74 129/79 129/84  Pulse: 74 78 83 76  Resp: _0 Temp: 98 F (36.7 C) 98.1 F (36.7 C) 98.7 F (37.1 C) 98.3 F (36.8 C)  TempSrc:   Oral Oral  SpO2: 100% 100% 100% 100%  Weight:      Height:       General: alert, cooperative and no distress Lochia: appropriate Uterine Fundus: firm Incision: N/A DVT Evaluation: No evidence of DVT seen on physical exam. No significant calf/ankle edema. Labs: Lab Results  Component Value Date    WBC 6.7 12/30/2019   HGB 10.2 (L) 12/30/2019   HCT 32.0 (L) 12/30/2019   MCV 92.2 12/30/2019   PLT 211 12/30/2019   CMP Latest Ref Rng & Units 12/30/2019  Glucose 70 - 99 mg/dL 94  BUN 6 - 20 mg/dL <5(L)  Creatinine 0.44 - 1.00 mg/dL 0.63  Sodium 135 - 145 mmol/L 136  Potassium 3.5 - 5.1 mmol/L 4.7  Chloride 98 - 111 mmol/L 106  CO2 22 - 32 mmol/L 22  Calcium 8.9 - 10.3 mg/dL 8.3(L)  Total Protein 6.5 - 8.1 g/dL 5.0(L)  Total Bilirubin 0.3 - 1.2 mg/dL 1.2  Alkaline Phos 38 - 126 U/L 150(H)  AST 15 - 41 U/L 26  ALT 0 - 44 U/L 8   Edinburgh Score: Edinburgh Postnatal Depression Scale Screening Tool 12/31/2019  I have been able to laugh and see the funny side of things. 0  I have looked forward with enjoyment to things. 0  I have blamed myself unnecessarily when things went wrong. 0  I have been anxious or worried for no good reason. 0  I have felt scared or panicky for no good reason. 0  Things have been getting on top of me. 0  I have been so unhappy that I have had difficulty sleeping. 0  I have felt sad or miserable. 0  I have been so unhappy that I have been crying. 0  The thought of  harming myself has occurred to me. 0  Edinburgh Postnatal Depression Scale Total 0     After visit meds:  Allergies as of 01/01/2020   No Known Allergies     Medication List    STOP taking these medications   calcium carbonate 500 MG chewable tablet Commonly known as: TUMS - dosed in mg elemental calcium     TAKE these medications   acetaminophen 325 MG tablet Commonly known as: TYLENOL Take 650 mg by mouth every 6 (six) hours as needed.   Blood Pressure Kit Devi 1 Device by Does not apply route as needed.   coconut oil Oil Apply 1 application topically as needed (nipple pain).   ibuprofen 600 MG tablet Commonly known as: ADVIL Take 1 tablet (600 mg total) by mouth every 8 (eight) hours as needed.   prenatal multivitamin Tabs tablet Take 1 tablet by mouth every morning.       Discharge home in stable condition Infant Feeding: Breast Infant Disposition:home with mother Discharge instruction: per After Visit Summary and Postpartum booklet. Activity: Advance as tolerated. Pelvic rest for 6 weeks.  Diet: routine diet Future Appointments: Future Appointments  Date Time Provider [place]  02/02/2020  1:15 PM Laury Deep, CNM [facility]   Follow up Visit: *Message sent to [facility] clinic to schedule appts on 01/01/20. Please schedule this patient for a In person postpartum visit in 4 weeks with the following provider: Any provider. Additional Postpartum F/U: 1 week BP check High risk pregnancy complicated by: HTN Delivery mode:  Vaginal, Spontaneous  Anticipated Birth Control:  IUD  01/01/2020 Randa Ngo, MD

## 2019-12-30 NOTE — H&P (Addendum)
OBSTETRIC ADMISSION HISTORY AND PHYSICAL  Marilyn Frey is a 39 y.o. female [phone] with IUP at 3w0dby LMP presenting for IOL for gHTN. She reports +FMs, No LOF, no VB, no blurry vision, headaches or peripheral edema, and RUQ pain.  She plans on breast feeding. She request an IUD outpatient for birth control. 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 Medical history non-contributory   . No pertinent past medical history     Past Surgical History: Past Surgical History:  Procedure Laterality Date  . NO PAST SURGERIES      Obstetrical History: OB History    Gravida  3   Para  2   Term  1   Preterm  1   AB      Living  2     SAB      TAB      Ectopic      Multiple      Live Births  2           Social History Social History   Socioeconomic History  . Marital status: Single    Spouse name: Not on file  . Number of children: Not on file  . Years of education: Not on file  . Highest education level: Not on file  Occupational History  . Not on file  Tobacco Use  . Smoking status: Never Smoker  . Smokeless tobacco: Never Used  Vaping Use  . Vaping Use: Never used  Substance and Sexual Activity  . Alcohol use: Not Currently    Comment: occasional beer  . Drug use: No  . Sexual activity: Yes    Birth control/protection: None  Other Topics Concern  . Not on file  Social History Narrative  . Not on file   Social Determinants of Health   Financial Resource Strain:   . Difficulty of Paying Living Expenses: Not on file  Food Insecurity: No Food Insecurity  . Worried About RCharity fundraiserin the Last Year: Never true  . Ran Out of Food in  the Last Year: Never true  Transportation Needs: No Transportation Needs  . Lack of Transportation (Medical): No  . Lack of Transportation (Non-Medical): No  Physical Activity:   . Days of Exercise per Week: Not on file  . Minutes of Exercise per Session: Not on file  Stress:   . Feeling of Stress : Not on file  Social Connections:   . Frequency of Communication with Friends and Family: Not on file  . Frequency of Social Gatherings with Friends and Family: Not on file  . Attends Religious Services: Not on file  . Active Member of Clubs or Organizations: Not on file  . Attends CArchivistMeetings: Not on file  . Marital Status: Not on file    Family History: Family History  Problem Relation Age of Onset  . Hypertension Mother   . Hyperlipidemia Mother   . Diabetes Father   . Hypertension Father   . Hyperlipidemia Father     Allergies: No Known Allergies  Medications Prior to Admission  Medication Sig Dispense Refill Last Dose  . acetaminophen (TYLENOL) 325 MG tablet Take 650 mg  by mouth every 6 (six) hours as needed.   Past Week at Unknown time  . Blood Pressure Monitoring (BLOOD PRESSURE KIT) DEVI 1 Device by Does not apply route as needed. 1 each 0 Past Week at Unknown time  . Prenatal Vit-Fe Fumarate-FA (PRENATAL MULTIVITAMIN) TABS Take 1 tablet by mouth every morning.   12/29/2019 at Unknown time  . calcium carbonate (TUMS - DOSED IN MG ELEMENTAL CALCIUM) 500 MG chewable tablet Chew 2 tablets by mouth daily.   Unknown at Unknown time     Review of Systems   All systems reviewed and negative except as stated in HPI  Blood pressure 132/77, pulse 86, temperature 98.2 F (36.8 C), temperature source Oral, resp. rate 16, height _0  (1.676 m), weight 98.5 kg, last menstrual period 03/25/2019, unknown if currently breastfeeding. General appearance: alert, cooperative, appears stated age and no distress Lungs: normal respiratory effort Heart: regular rate and  rhythm Abdomen: soft, non-tender; bowel sounds normal Pelvic: deferred Extremities: No sign of DVT, trace edema b/l Presentation: cephalic Fetal monitoringBaseline: 135 bpm, Variability: Good {> 6 bpm), Accelerations: Reactive and Decelerations: Variable: mild Uterine activityFrequency: Every 2-4 minutes Dilation: 3 Effacement (%): 50 Station: -3 Exam by:: Lenox Ponds, RN   Prenatal labs: ABO, Rh: --/--/PENDING (09/02 0825) Antibody: PENDING (09/02 0825) Rubella: 3.38 (03/30 1458) RPR: Non Reactive (06/14 0903)  HBsAg: Negative (03/30 1458)  HIV: Non Reactive (06/14 0903)  GBS: Negative/-- (08/10 1159)  2h GTT: passed 80/126/145 Genetic screening: NIPS- low risk female, AFP negative Anatomy US: Normal  Prenatal Transfer Tool  Maternal Diabetes: No Genetic Screening: Normal Maternal Ultrasounds/Referrals: Normal Fetal Ultrasounds or other Referrals:  None Maternal Substance Abuse:  No Significant Maternal Medications:  None Significant Maternal Lab Results: Group B Strep negative  Results for orders placed or performed during the hospital encounter of 12/30/19 (from the past 24 hour(s))  CBC   Collection Time: 12/30/19  8:25 AM  Result Value Ref Range   WBC 6.7 4.0 - 10.5 K/uL   RBC 3.47 (L) 3.87 - 5.11 MIL/uL   Hemoglobin 10.2 (L) 12.0 - 15.0 g/dL   HCT 32.0 (L) 36 - 46 %   MCV 92.2 80.0 - 100.0 fL   MCH 29.4 26.0 - 34.0 pg   MCHC 31.9 30.0 - 36.0 g/dL   RDW 13.0 11.5 - 15.5 %   Platelets 211 150 - 400 K/uL   nRBC 0.0 0.0 - 0.2 %  Type and screen   Collection Time: 12/30/19  8:25 AM  Result Value Ref Range   ABO/RH(D) PENDING    Antibody Screen PENDING    Sample Expiration      01/02/2020,2359 Performed at [facility], [address], [place], [place] [address]     Patient Active Problem List   Diagnosis Date Noted  . Gestational hypertension 12/30/2019  . Genetic carrier status 08/24/2019  . Supervision of high risk pregnancy, antepartum  07/22/2019  . AMA (advanced maternal age) multigravida 35+ 07/22/2019    Assessment/Plan:  Marilyn Frey is a 39 y.o. G3P1102 at 16w0dhere for IOL for gHTN.  #Labor: Pitocin started _1 , plan to titrate up to adequate contractions. #Pain: Per patient request #FWB: Cat II for variable decels, reassuring due to accels and moderate variability #ID:  GBS negative #MOF: breast #MOC: IUD outpatient #Circ:  Yes #gHTN: Elevated BP in clinic 146/86, no other record of elevated BP. Most recent BP 132/77. Not on medications. Asymptomatic. PreE labs pending. #SMA carrier:  Unknown if FOB is also carrier   Sharion Settler, DO  12/30/2019, 9:51 AM  GME ATTESTATION:  I saw and evaluated the patient. I agree with the findings and the plan of care as documented in the resident's note.  Arrie Senate, MD OB Fellow, [place] [facility] [place] 12/30/2019 10:51 AM

## 2019-12-30 NOTE — Discharge Instructions (Signed)

## 2019-12-30 NOTE — Anesthesia Preprocedure Evaluation (Signed)
Anesthesia Evaluation  Patient identified by MRN, date of birth, ID band Patient awake    Reviewed: Allergy & Precautions, H&P , NPO status , Patient's Chart, lab work & pertinent test results, reviewed documented beta blocker date and time   Airway Mallampati: II  TM Distance: >3 FB Neck ROM: full    Dental no notable dental hx. (+) Teeth Intact, Dental Advisory Given   Pulmonary neg pulmonary ROS,    Pulmonary exam normal breath sounds clear to auscultation       Cardiovascular hypertension, Pt. on medications Normal cardiovascular exam Rhythm:regular Rate:Normal     Neuro/Psych negative neurological ROS  negative psych ROS   GI/Hepatic negative GI ROS, Neg liver ROS,   Endo/Other  Morbid obesity  Renal/GU negative Renal ROS  negative genitourinary   Musculoskeletal   Abdominal   Peds  Hematology negative hematology ROS (+)   Anesthesia Other Findings   Reproductive/Obstetrics (+) Pregnancy                             Anesthesia Physical Anesthesia Plan  ASA: III  Anesthesia Plan: Epidural   Post-op Pain Management:    Induction:   PONV Risk Score and Plan:   Airway Management Planned:   Additional Equipment:   Intra-op Plan:   Post-operative Plan:   Informed Consent: I have reviewed the patients History and Physical, chart, labs and discussed the procedure including the risks, benefits and alternatives for the proposed anesthesia with the patient or authorized representative who has indicated his/her understanding and acceptance.     Dental Advisory Given  Plan Discussed with: Anesthesiologist  Anesthesia Plan Comments: (Labs checked- platelets confirmed with RN in room. Fetal heart tracing, per RN, reported to be stable enough for sitting procedure. Discussed epidural, and patient consents to the procedure:  included risk of possible headache,backache, failed block,  allergic reaction, and nerve injury. This patient was asked if she had any questions or concerns before the procedure started.)        Anesthesia Quick Evaluation

## 2019-12-30 NOTE — Plan of Care (Signed)
L&D careplan completed 

## 2019-12-31 ENCOUNTER — Encounter (HOSPITAL_COMMUNITY): Payer: Self-pay | Admitting: Obstetrics and Gynecology

## 2019-12-31 MED ORDER — PRENATAL MULTIVITAMIN CH
1.0000 | ORAL_TABLET | Freq: Every day | ORAL | Status: DC
  Administered 2019-12-31: 1 via ORAL
  Filled 2019-12-31: qty 1

## 2019-12-31 MED ORDER — DIPHENHYDRAMINE HCL 25 MG PO CAPS: 25.0000 mg | ORAL_CAPSULE | Freq: Four times a day (QID) | ORAL | Status: DC | PRN

## 2019-12-31 MED ORDER — FERROUS SULFATE 325 (65 FE) MG PO TABS
325.0000 mg | ORAL_TABLET | Freq: Two times a day (BID) | ORAL | Status: DC
  Administered 2019-12-31 – 2020-01-01 (×3): 325 mg via ORAL
  Filled 2019-12-31 (×3): qty 1

## 2019-12-31 MED ORDER — DIBUCAINE (PERIANAL) 1 % EX OINT: 1.0000 "application " | TOPICAL_OINTMENT | CUTANEOUS | Status: DC | PRN

## 2019-12-31 MED ORDER — WITCH HAZEL-GLYCERIN EX PADS: 1.0000 "application " | MEDICATED_PAD | CUTANEOUS | Status: DC | PRN

## 2019-12-31 MED ORDER — COCONUT OIL OIL: 1.0000 "application " | TOPICAL_OIL | Status: DC | PRN

## 2019-12-31 MED ORDER — TETANUS-DIPHTH-ACELL PERTUSSIS 5-2.5-18.5 LF-MCG/0.5 IM SUSP: 0.5000 mL | Freq: Once | INTRAMUSCULAR | Status: DC

## 2019-12-31 MED ORDER — ACETAMINOPHEN 325 MG PO TABS: 650.0000 mg | ORAL_TABLET | ORAL | Status: DC | PRN

## 2019-12-31 MED ORDER — BENZOCAINE-MENTHOL 20-0.5 % EX AERO
1.0000 "application " | INHALATION_SPRAY | CUTANEOUS | Status: DC | PRN
  Administered 2019-12-31: 1 via TOPICAL
  Filled 2019-12-31: qty 56

## 2019-12-31 MED ORDER — BISACODYL 10 MG RE SUPP: 10.0000 mg | Freq: Every day | RECTAL | Status: DC | PRN

## 2019-12-31 MED ORDER — ONDANSETRON HCL 4 MG/2ML IJ SOLN: 4.0000 mg | INTRAMUSCULAR | Status: DC | PRN

## 2019-12-31 MED ORDER — IBUPROFEN 600 MG PO TABS
600.0000 mg | ORAL_TABLET | Freq: Four times a day (QID) | ORAL | Status: DC
  Administered 2019-12-31 – 2020-01-01 (×6): 600 mg via ORAL
  Filled 2019-12-31 (×6): qty 1

## 2019-12-31 MED ORDER — DOCUSATE SODIUM 100 MG PO CAPS
100.0000 mg | ORAL_CAPSULE | Freq: Two times a day (BID) | ORAL | Status: DC
  Administered 2019-12-31 (×2): 100 mg via ORAL
  Filled 2019-12-31 (×2): qty 1

## 2019-12-31 MED ORDER — SIMETHICONE 80 MG PO CHEW
80.0000 mg | CHEWABLE_TABLET | ORAL | Status: DC | PRN
  Filled 2019-12-31: qty 1

## 2019-12-31 MED ORDER — ZOLPIDEM TARTRATE 5 MG PO TABS: 5.0000 mg | ORAL_TABLET | Freq: Every evening | ORAL | Status: DC | PRN

## 2019-12-31 MED ORDER — ONDANSETRON HCL 4 MG PO TABS: 4.0000 mg | ORAL_TABLET | ORAL | Status: DC | PRN

## 2019-12-31 MED ORDER — METHYLERGONOVINE MALEATE 0.2 MG PO TABS: 0.2000 mg | ORAL_TABLET | ORAL | Status: DC | PRN

## 2019-12-31 MED ORDER — MEASLES, MUMPS & RUBELLA VAC IJ SOLR: 0.5000 mL | Freq: Once | INTRAMUSCULAR | Status: DC

## 2019-12-31 MED ORDER — METHYLERGONOVINE MALEATE 0.2 MG/ML IJ SOLN: 0.2000 mg | INTRAMUSCULAR | Status: DC | PRN

## 2019-12-31 MED ORDER — FLEET ENEMA 7-19 GM/118ML RE ENEM: 1.0000 | ENEMA | Freq: Every day | RECTAL | Status: DC | PRN

## 2019-12-31 NOTE — Anesthesia Postprocedure Evaluation (Signed)
Anesthesia Post Note  Patient: Marilyn Frey  Procedure(s) Performed: AN AD HOC LABOR EPIDURAL     Patient location during evaluation: Mother Baby Anesthesia Type: Epidural Level of consciousness: awake and alert Pain management: pain level controlled Vital Signs Assessment: post-procedure vital signs reviewed and stable Respiratory status: spontaneous breathing, nonlabored ventilation and respiratory function stable Cardiovascular status: stable Postop Assessment: no headache, no backache and epidural receding Anesthetic complications: no   No complications documented.  Last Vitals:  Vitals:   12/31/19 0148 12/31/19 0553  BP: 129/84 129/79  Pulse: 82 90  Resp: 18 18  Temp: 36.8 C 36.8 C  SpO2: 99% 99%    Last Pain:  Vitals:   12/31/19 0553  TempSrc:   PainSc: 0-No pain   Pain Goal:                   Lenee Franze

## 2019-12-31 NOTE — Progress Notes (Signed)
Post Partum Day 1 Subjective: no complaints, up ad lib, voiding and tolerating PO, small lochia, plans to breastfeed, IUD  Objective: Blood pressure 129/79, pulse 90, temperature 98.2 F (36.8 C), resp. rate 18, height 5\' 6"  (1.676 m), weight 98.5 kg, last menstrual period 03/25/2019, SpO2 99 %, unknown if currently breastfeeding.  Physical Exam:  General: alert, cooperative and no distress Lochia:normal flow Chest: CTAB Heart: RRR no m/r/g Abdomen: +BS, soft, nontender,  Uterine Fundus: firm DVT Evaluation: No evidence of DVT seen on physical exam. Extremities: 1+ edema  Recent Labs    12/30/19 0825  HGB 10.2*  HCT 32.0*    Assessment/Plan: Plan for discharge tomorrow and Circumcision prior to discharge   LOS: 1 day   02/29/20 12/31/2019, 8:06 AM

## 2019-12-31 NOTE — Progress Notes (Signed)
Post Partum Day 1 Subjective: no complaints, up ad lib, voiding and tolerating PO, small lochia, plans to breastfeed, IUD  Objective: Blood pressure 129/79, pulse 90, temperature 98.2 F (36.8 C), resp. rate 18, height 5\' 6"  (1.676 m), weight 98.5 kg, last menstrual period 03/25/2019, SpO2 99 %, unknown if currently breastfeeding.  Physical Exam:  General: alert, cooperative and no distress Lochia:normal flow Chest: CTAB Heart: RRR no m/r/g Abdomen: +BS, soft, nontender,  Uterine Fundus: firm DVT Evaluation: No evidence of DVT seen on physical exam. Extremities: trace edema  Recent Labs    12/30/19 0825  HGB 10.2*  HCT 32.0*    Assessment/Plan: GHTN, improved since delivery Plan DC tomorrow Circ prior to delivery   LOS: 1 day   02/29/20 12/31/2019, 8:23 AM

## 2020-01-01 MED ORDER — COCONUT OIL OIL: 1.0000 "application " | TOPICAL_OIL | 0 refills | Status: AC | PRN

## 2020-01-01 MED ORDER — IBUPROFEN 600 MG PO TABS: 600.0000 mg | ORAL_TABLET | Freq: Three times a day (TID) | ORAL | 0 refills | Status: AC | PRN

## 2020-01-06 ENCOUNTER — Encounter: Payer: Self-pay | Admitting: General Practice

## 2020-01-10 ENCOUNTER — Ambulatory Visit: Payer: 59

## 2020-02-02 ENCOUNTER — Ambulatory Visit: Payer: 59 | Admitting: Obstetrics and Gynecology

## 2020-02-07 ENCOUNTER — Ambulatory Visit: Payer: 59 | Admitting: Obstetrics and Gynecology

## 2020-03-13 ENCOUNTER — Ambulatory Visit: Payer: 59 | Admitting: Certified Nurse Midwife

## 2020-05-06 ENCOUNTER — Ambulatory Visit: Payer: 59 | Attending: Internal Medicine

## 2020-05-06 DIAGNOSIS — Z23 Encounter for immunization: Secondary | ICD-10-CM

## 2020-05-06 NOTE — Progress Notes (Signed)
   Covid-19 Vaccination Clinic  Name:  EVITA MERIDA    MRN: [id number] DOB: 12/18/1980  05/06/2020  Ms. Ronning was observed post Covid-19 immunization for 15 minutes without incident. She was provided with Vaccine Information Sheet and instruction to access the V-Safe system.   Ms. Rosten was instructed to call 911 with any severe reactions post vaccine: Marland Kitchen Difficulty breathing  . Swelling of face and throat  . A fast heartbeat  . A bad rash all over body  . Dizziness and weakness   Immunizations Administered    Name Date Dose VIS Date Route   Pfizer COVID-19 Vaccine 05/06/2020 11:39 AM 0.3 mL 02/16/2020 Intramuscular   Manufacturer: [employer], [employer]   Lot: [id number]   NDC: [address]-7

## 2021-08-07 IMAGING — US US MFM OB DETAIL+14 WK
1 series · 13 of 28 positions shown · non-contrast
Comparison: none

[Series 1: us mfm ob detail+14 wk · 13 of 78 slices shown]
[im 3/78]
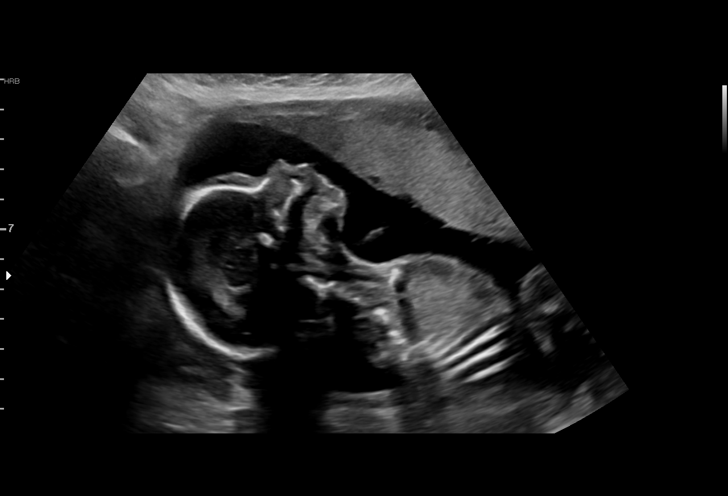
[im 9/78]
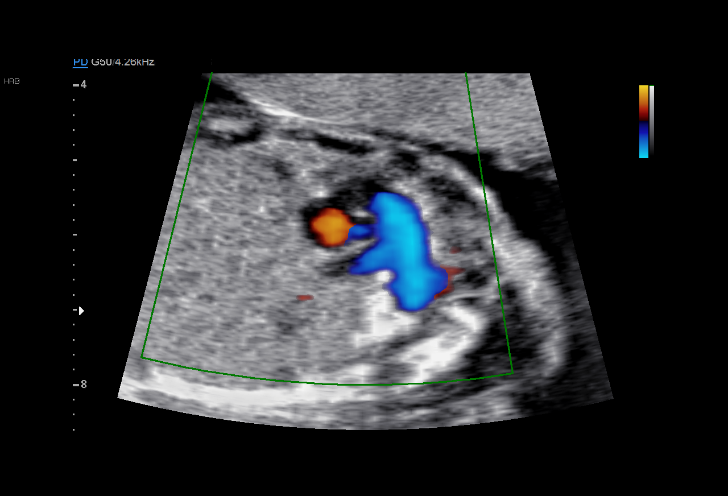
[im 15/78]
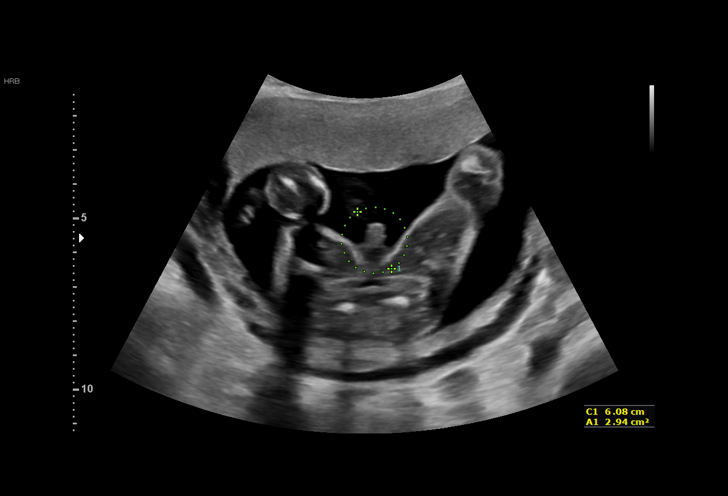
[im 20/78]
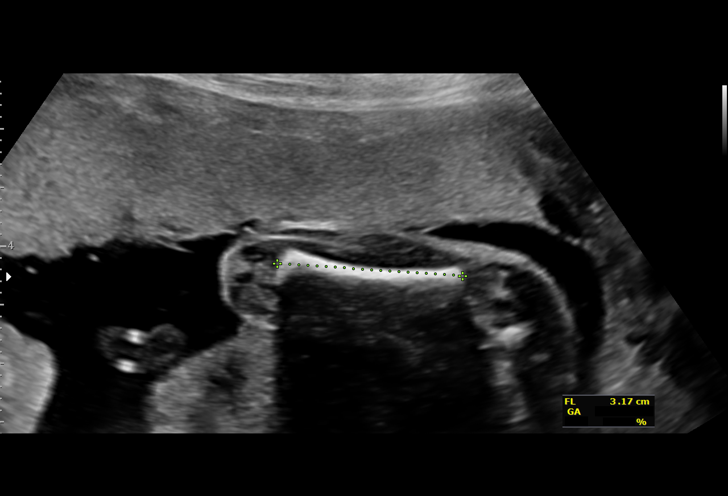
[im 26/78]
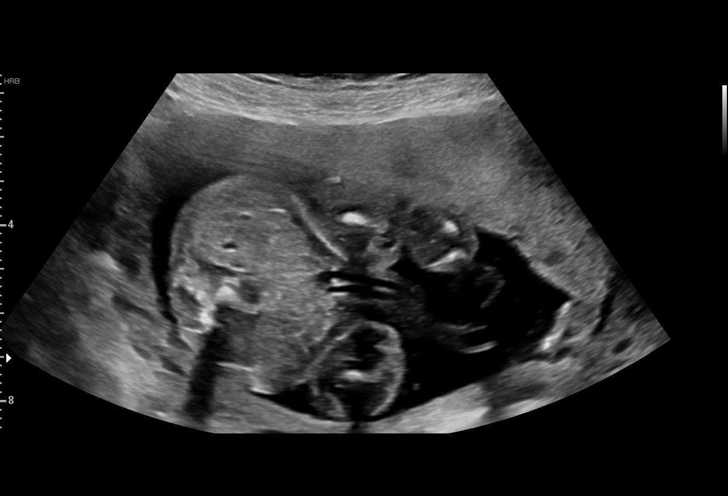
[im 32/78]
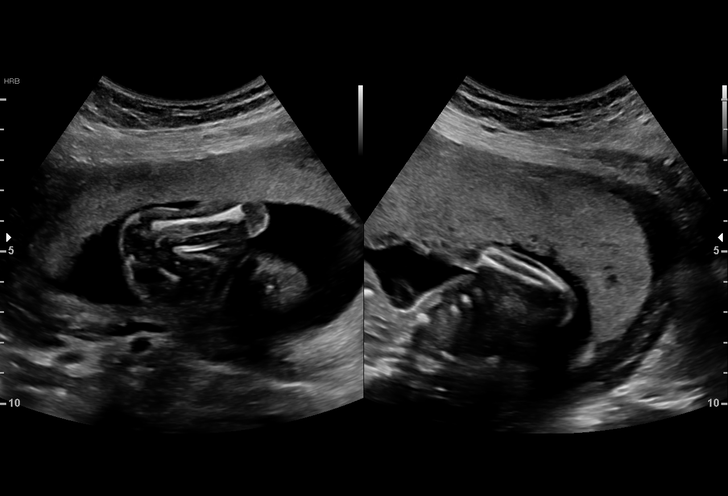
[im 40/78]
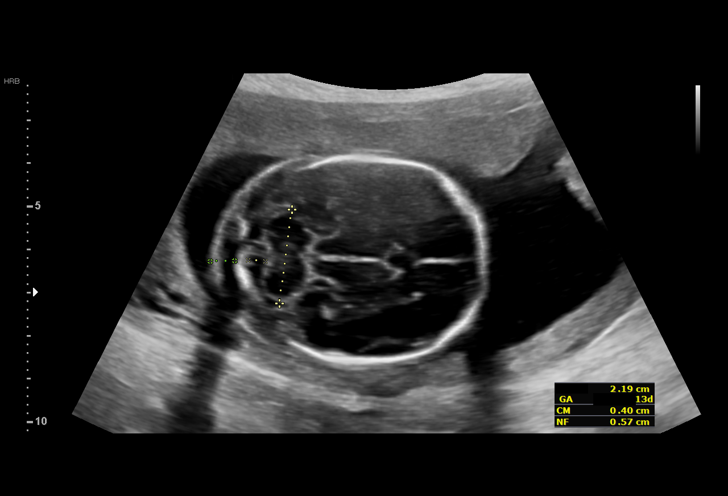
[im 46/78]
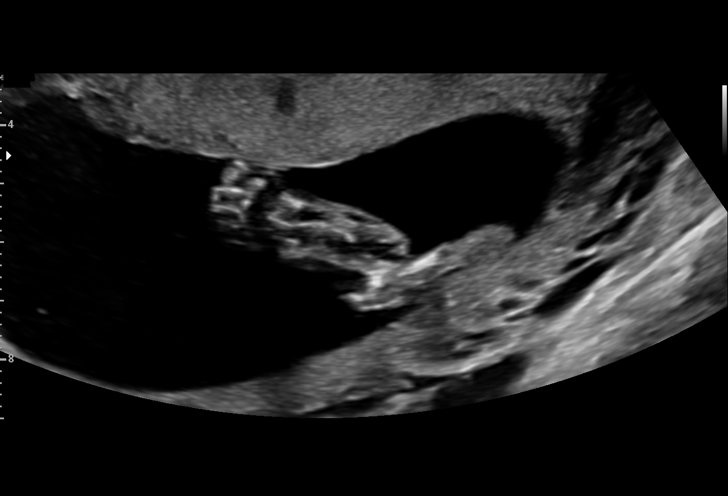
[im 52/78]
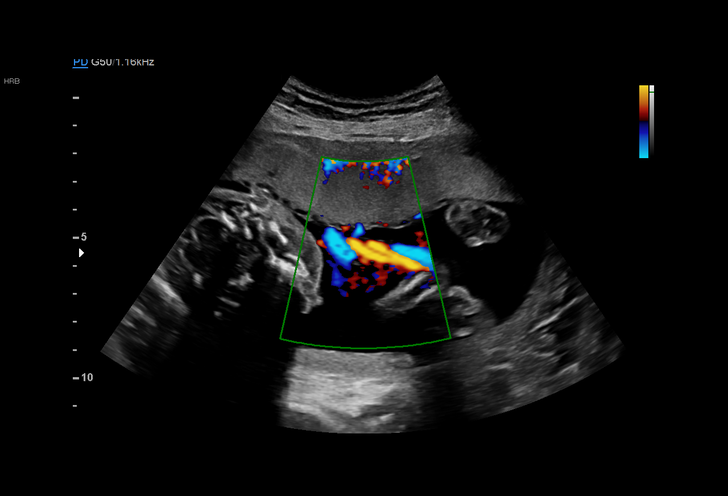
[im 58/78]
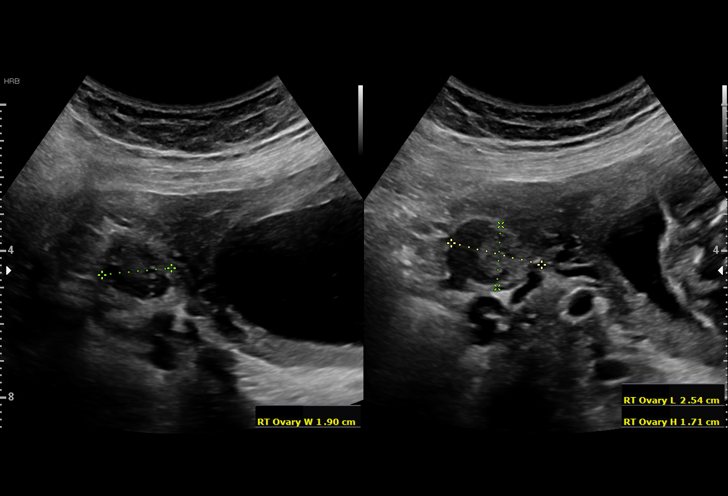
[im 63/78]
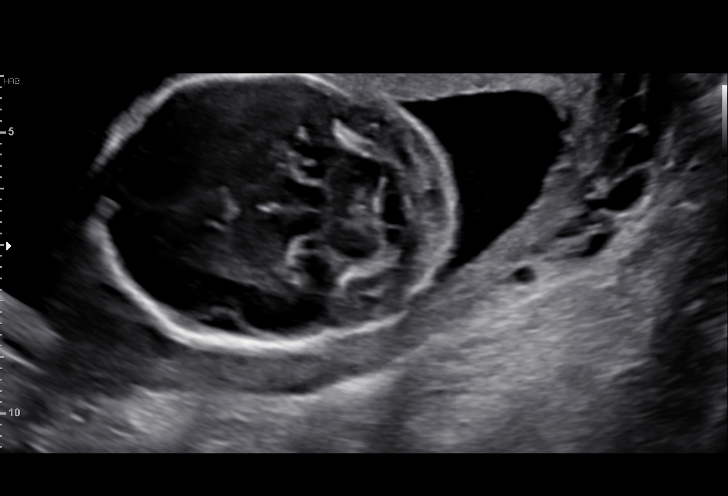
[im 69/78]
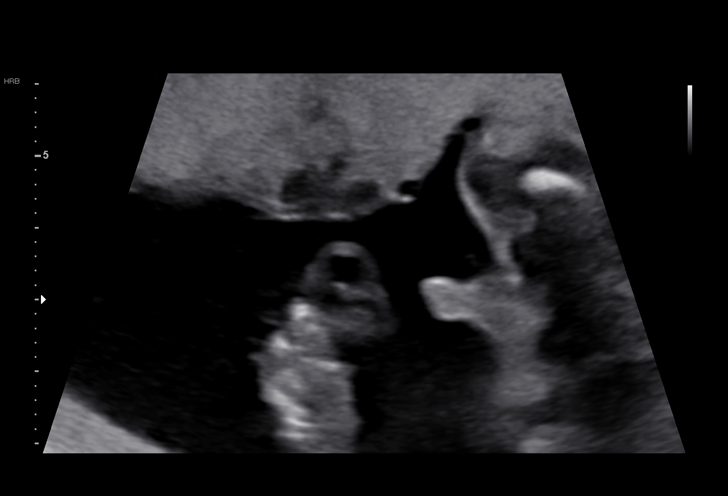
[im 75/78]
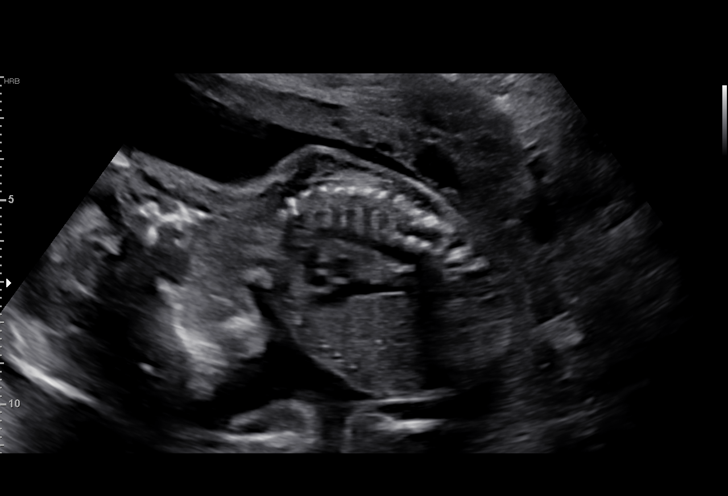

[13 of 28 positions shown; findings below may reference images not displayed]

FG NP

 ----------------------------------------------------------------------

 ----------------------------------------------------------------------
Indications

  Advanced maternal age multigravida 35+,
  second trimester (38y)
  Encounter for antenatal screening for
  malformations (low risk NIPS)
  20 weeks gestation of pregnancy
 ----------------------------------------------------------------------
Fetal Evaluation

 Num Of Fetuses:         1
 Cardiac Activity:       Observed
 Presentation:           Cephalic
 Placenta:               Anterior
 P. Cord Insertion:      Visualized, central

 Amniotic Fluid
 AFI FV:      Within normal limits

                             Largest Pocket(cm)

Biometry

 BPD:      47.3  mm     G. Age:  20w 2d         57  %    CI:        70.97   %    70 - 86
                                                         FL/HC:      17.6   %    16.8 -
 HC:      178.9  mm     G. Age:  20w 2d         51  %    HC/AC:      1.12        1.09 -
 AC:      159.6  mm     G. Age:  21w 1d         74  %    FL/BPD:     66.6   %
 FL:       31.5  mm     G. Age:  19w 6d         29  %    FL/AC:      19.7   %    20 - 24
 CER:      21.9  mm     G. Age:  20w 6d         61  %
 NFT:       5.7  mm

 LV:        5.5  mm
 CM:          4  mm

 Est. FW:     356  gm    0 lb 13 oz      64  %
OB History

 Gravidity:    3         Term:   1        Prem:   1
 Living:       2
Gestational Age

 LMP:           20w 1d        Date:  03/25/19                 EDD:   12/30/19
 U/S Today:     20w 3d                                        EDD:   12/28/19
 Best:          20w 1d     Det. By:  LMP  (03/25/19)          EDD:   12/30/19
Anatomy

 Cranium:               Appears normal         Aortic Arch:            Appears normal
 Cavum:                 Appears normal         Ductal Arch:            Appears normal
 Ventricles:            Appears normal         Diaphragm:              Appears normal
 Choroid Plexus:        Appears normal         Stomach:                Appears normal, left
                                                                       sided
 Cerebellum:            Appears normal         Abdomen:                Appears normal
 Posterior Fossa:       Appears normal         Abdominal Wall:         Not well visualized
 Nuchal Fold:           Appears normal         Cord Vessels:           Appears normal (3
                                                                       vessel cord)
 Face:                  Appears normal         Kidneys:                Appear normal
                        (orbits and profile)
 Lips:                  Appears normal         Bladder:                Appears normal
 Thoracic:              Appears normal         Spine:                  Not well visualized
 Heart:                 Appears normal         Upper Extremities:      Appears normal
                        (4CH, axis, and
                        situs)
 RVOT:                  Appears normal         Lower Extremities:      Appears normal
 LVOT:                  Appears normal

 Other:  Male gender. Heels and 5th digit visualized. Technically difficult due
         to fetal position.
Cervix Uterus Adnexa

 Cervix
 Length:            4.3  cm.
 Normal appearance by transabdominal scan.

 Uterus
 No abnormality visualized.

 Left Ovary
 Within normal limits.

 Right Ovary
 Within normal limits.

 Cul De Sac
 No free fluid seen.

 Adnexa
 No abnormality visualized.
Comments

 This patient was seen for a detailed fetal anatomy scan due
 to advanced maternal age.
 She denies any significant past medical history and denies
 any problems in her current pregnancy.
 She had a cell free DNA test earlier in her pregnancy which
 indicated a low risk for trisomy 21, 18, and 13. A male fetus is
 predicted.
 She was informed that the fetal growth and amniotic fluid
 level were appropriate for her gestational age.
 There were no obvious fetal anomalies noted on today's
 ultrasound exam.  However, the views of the fetal spine were
 suboptimal today due to the fetal position.
 The patient was informed that anomalies may be missed due
 to technical limitations. If the fetus is in a suboptimal position
 or maternal habitus is increased, visualization of the fetus in
 the maternal uterus may be impaired.
 A follow-up exam was scheduled in 4 weeks to complete the
 views of the fetal anatomy.

## 2021-10-30 IMAGING — US US MFM OB FOLLOW-UP
1 series · 14 of 28 positions shown · non-contrast
Comparison: none

[Series 1: us mfm ob follow-up · 14 of 59 slices shown]
[im 3/59]
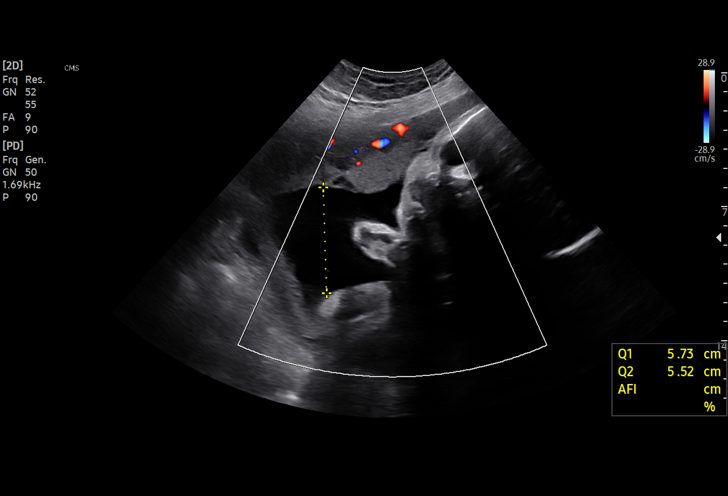
[im 7/59]
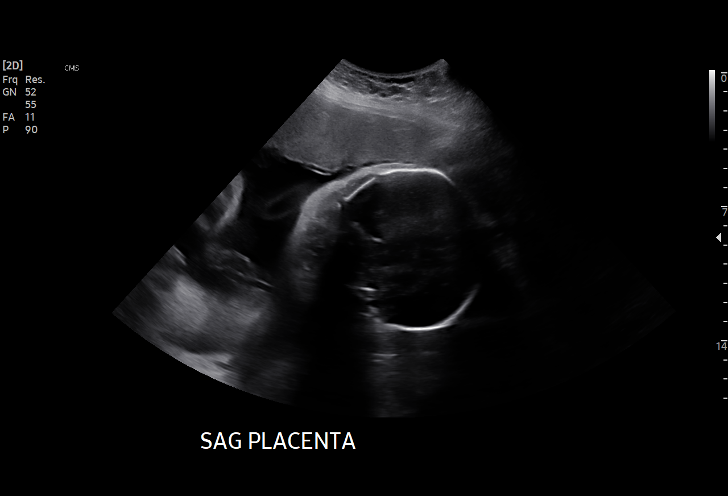
[im 11/59]
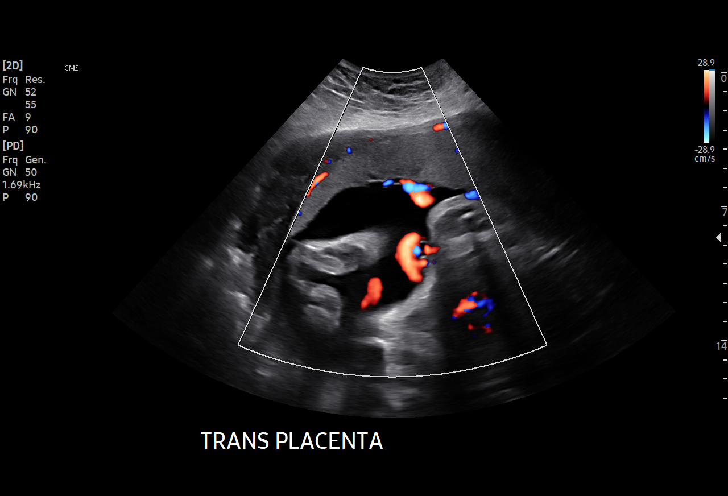
[im 16/59]
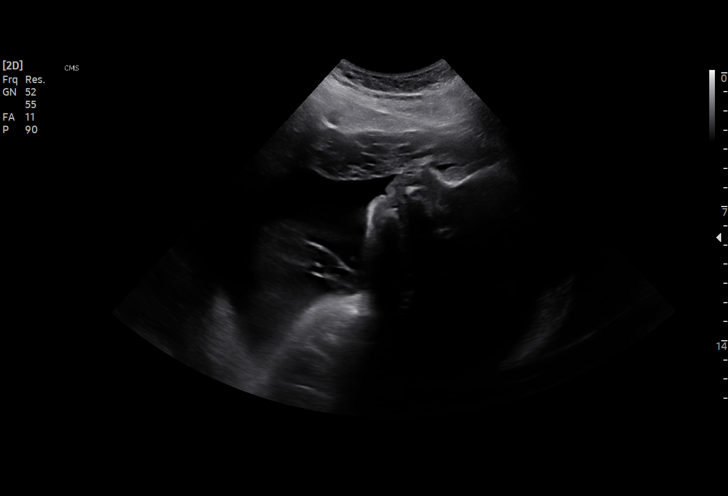
[im 20/59]
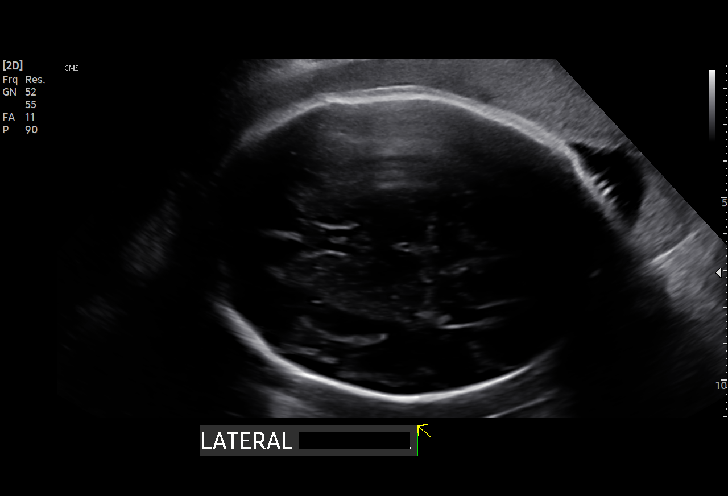
[im 24/59]
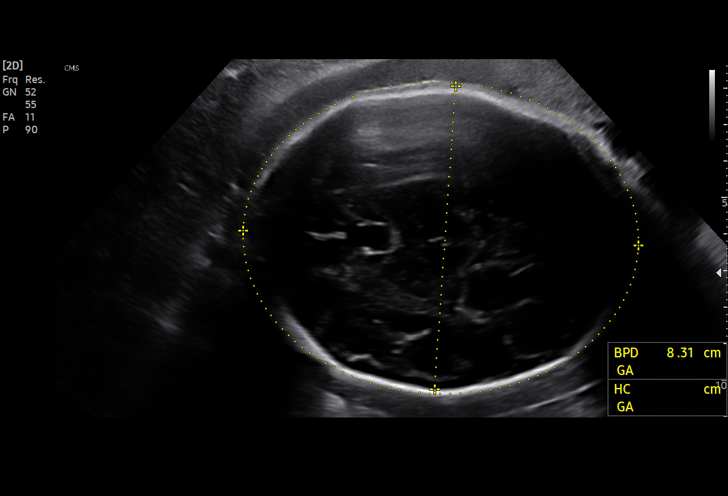
[im 28/59]
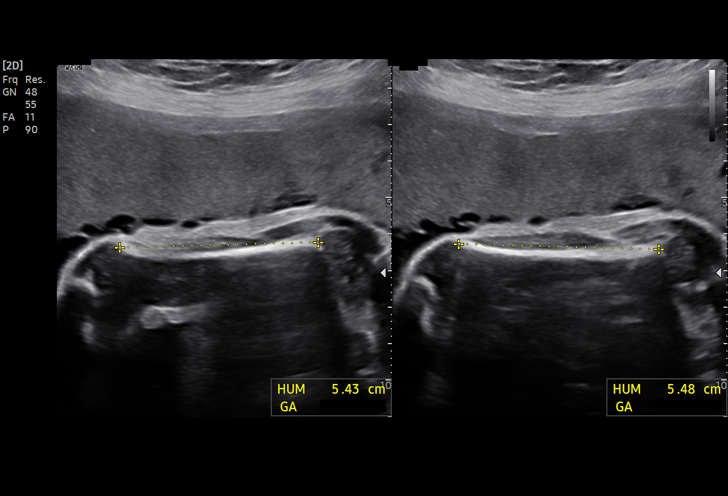
[im 33/59]
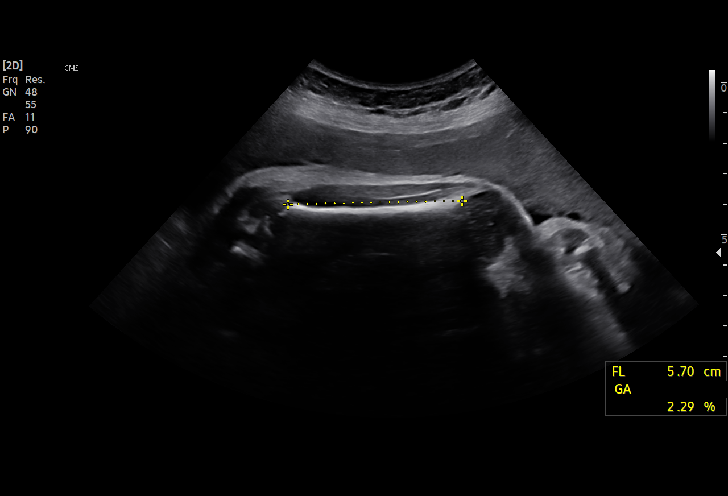
[im 37/59]
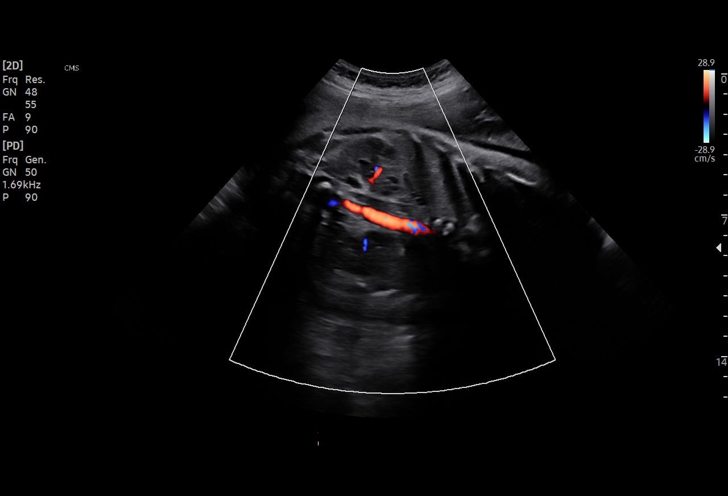
[im 41/59]
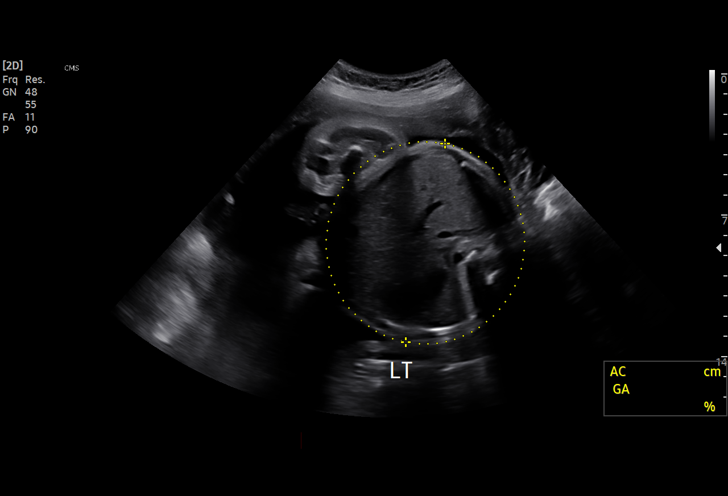
[im 46/59]
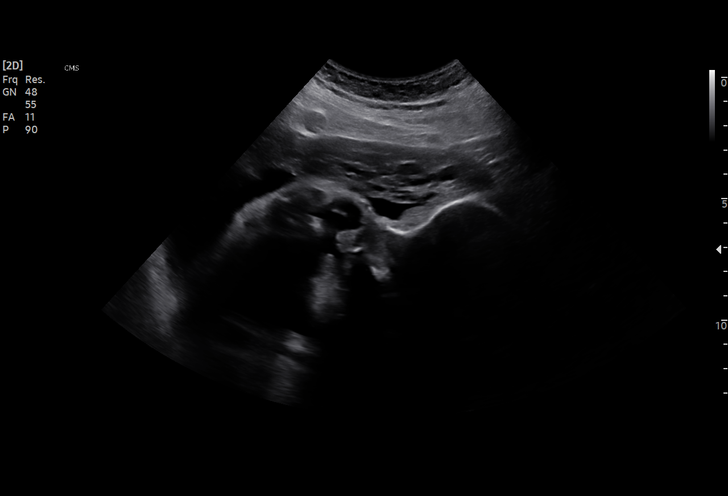
[im 50/59]
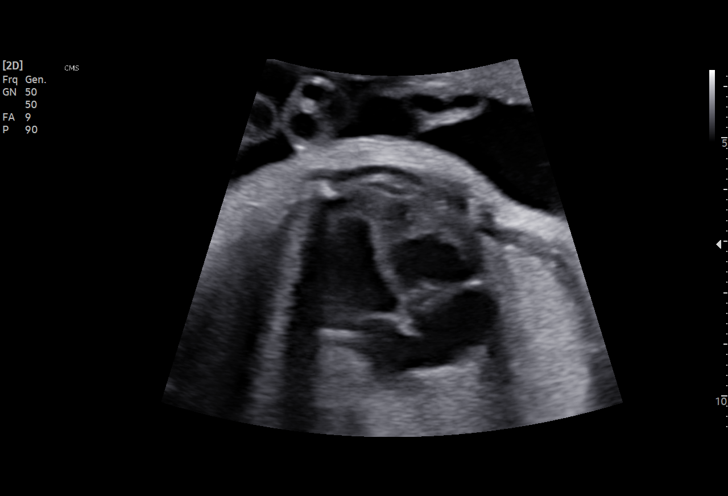
[im 54/59]
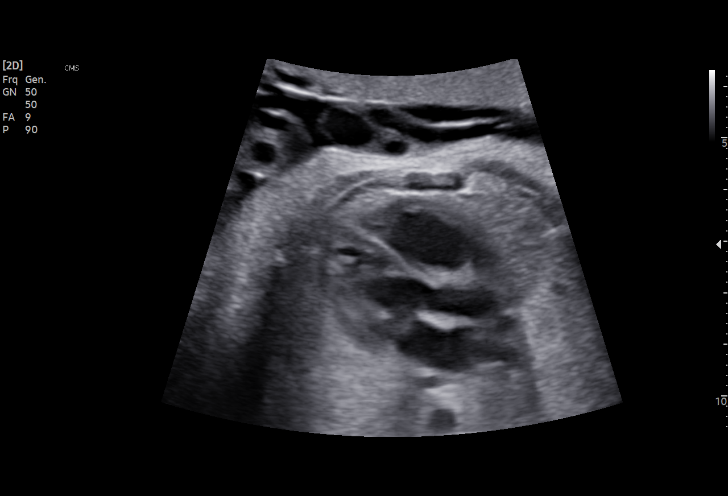
[im 59/59]
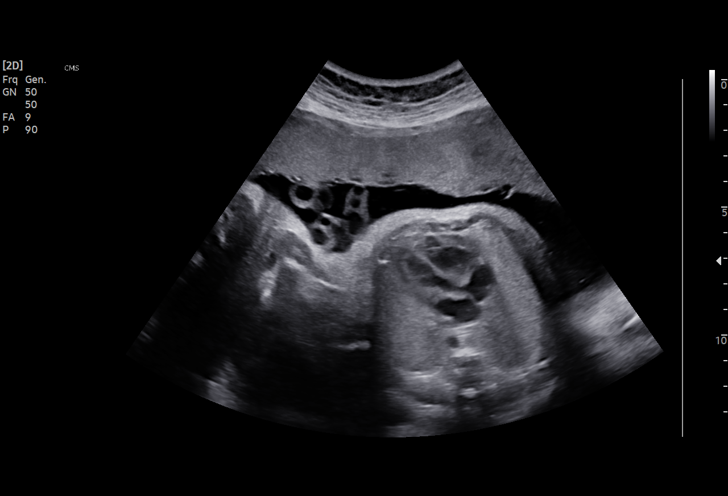

[14 of 28 positions shown; findings below may reference images not displayed]

ANASTACIA NP

Indications

 Advanced maternal age multigravida 35+,
 third trimester (low risk NIPS)
 Encounter for other antenatal screening
 follow-up
 Genetic carrier (SMA)
 32 weeks gestation of pregnancy
Vital Signs

                                                Height:        5'6"
Fetal Evaluation

 Num Of Fetuses:         1
 Fetal Heart Rate(bpm):  133
 Cardiac Activity:       Observed
 Presentation:           Cephalic
 Placenta:               Anterior
 P. Cord Insertion:      Visualized

 Amniotic Fluid
 AFI FV:      Within normal limits

 AFI Sum(cm)     %Tile       Largest Pocket(cm)
 20.7            79

 RUQ(cm)       RLQ(cm)       LUQ(cm)        LLQ(cm)

Biometry

 BPD:      83.3  mm     G. Age:  33w 4d         80  %    CI:        73.05   %    70 - 86
                                                         FL/HC:      18.5   %    19.1 -
 HC:      309.8  mm     G. Age:  34w 4d         78  %    HC/AC:      1.00        0.96 -
 AC:      310.4  mm     G. Age:  35w 0d         98  %    FL/BPD:     68.9   %    71 - 87
 FL:       57.4  mm     G. Age:  30w 1d          3  %    FL/AC:      18.5   %    20 - 24
 HUM:      54.7  mm     G. Age:  31w 6d         45  %

 Est. FW:    9957  gm    4 lb 14 oz      81  %
OB History

 Gravidity:    3         Term:   1        Prem:   1
 Living:       2
Gestational Age

 LMP:           32w 1d        Date:  03/25/19                 EDD:   12/30/19
 U/S Today:     33w 2d                                        EDD:   12/22/19
 Best:          32w 1d     Det. By:  LMP  (03/25/19)          EDD:   12/30/19
Anatomy

 Cranium:               Appears normal         Aortic Arch:            Previously seen
 Cavum:                 Appears normal         Ductal Arch:            Previously seen
 Ventricles:            Appears normal         Diaphragm:              Previously seen
 Choroid Plexus:        Previously seen        Stomach:                Appears normal, left
                                                                       sided
 Cerebellum:            Appears normal         Abdomen:                Appears normal
 Posterior Fossa:       Appears normal         Abdominal Wall:         Previously seen
 Nuchal Fold:           Previously seen        Cord Vessels:           Previously seen
 Face:                  Appears normal         Kidneys:                Appear normal
                        (orbits and profile)
 Lips:                  Appears normal         Bladder:                Appears normal
 Thoracic:              Appears normal         Spine:                  Previously seen
 Heart:                 Previously seen        Upper Extremities:      Previously seen
 RVOT:                  Appears normal         Lower Extremities:      Previously seen
 LVOT:                  Appears normal

 Other:  Heels and 5th digit previously visualized.
Cervix Uterus Adnexa

 Cervix
 Not visualized (advanced GA >80wks)

 Uterus
 No abnormality visualized.

 Right Ovary
 Not visualized.

 Left Ovary
 Not visualized.

 Cul De Sac
 No free fluid seen.

 Adnexa
 No abnormality visualized.
Comments

 This patient was seen for a follow up growth scan due to
 advanced maternal age.  She reports that she has screened
 negative for gestational diabetes in her current pregnancy.
 She was informed that the fetal growth and amniotic fluid
 level appears appropriate for her gestational age.
 As the fetal growth is within normal limits, no further exams
 were scheduled in our office.
